# Patient Record
Sex: Female | Born: 1995 | Race: White | Hispanic: No | Marital: Single | Smoking: Never smoker
Health system: Southern US, Community
[De-identification: ages and names within clinical notes are randomized; demographics above are authoritative.]

## PROBLEM LIST (undated history)

## (undated) DIAGNOSIS — D691 Qualitative platelet defects: Secondary | ICD-10-CM

## (undated) DIAGNOSIS — D681 Hereditary factor XI deficiency: Secondary | ICD-10-CM

## (undated) DIAGNOSIS — C4491 Basal cell carcinoma of skin, unspecified: Secondary | ICD-10-CM

## (undated) DIAGNOSIS — F909 Attention-deficit hyperactivity disorder, unspecified type: Secondary | ICD-10-CM

## (undated) DIAGNOSIS — E785 Hyperlipidemia, unspecified: Secondary | ICD-10-CM

## (undated) DIAGNOSIS — D699 Hemorrhagic condition, unspecified: Secondary | ICD-10-CM

## (undated) HISTORY — DX: Attention-deficit hyperactivity disorder, unspecified type: F90.9

## (undated) HISTORY — DX: Basal cell carcinoma of skin, unspecified: C44.91

## (undated) HISTORY — PX: INGUINAL HERNIA REPAIR: SUR1180

## (undated) HISTORY — DX: Hereditary factor XI deficiency: D68.1

## (undated) HISTORY — DX: Hyperlipidemia, unspecified: E78.5

## (undated) HISTORY — PX: TYMPANOSTOMY TUBE PLACEMENT: SHX32

---
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
## 2014-01-14 ENCOUNTER — Encounter: Payer: Self-pay | Admitting: Obstetrics & Gynecology

## 2014-01-16 ENCOUNTER — Encounter: Payer: Self-pay | Admitting: Obstetrics & Gynecology

## 2014-01-16 ENCOUNTER — Ambulatory Visit (INDEPENDENT_AMBULATORY_CARE_PROVIDER_SITE_OTHER): Payer: Federal, State, Local not specified - PPO | Admitting: Obstetrics & Gynecology

## 2014-01-16 VITALS — BP 92/58 | HR 68 | Resp 12 | Ht 67.0 in | Wt 161.2 lb

## 2014-01-16 DIAGNOSIS — Z01419 Encounter for gynecological examination (general) (routine) without abnormal findings: Secondary | ICD-10-CM

## 2014-01-16 DIAGNOSIS — D682 Hereditary deficiency of other clotting factors: Secondary | ICD-10-CM

## 2014-01-16 DIAGNOSIS — Z0000 Encounter for general adult medical examination without abnormal findings: Secondary | ICD-10-CM

## 2014-01-16 LAB — POCT URINALYSIS DIPSTICK
Bilirubin, UA: NEGATIVE
Glucose, UA: NEGATIVE
KETONES UA: NEGATIVE
LEUKOCYTES UA: NEGATIVE
Nitrite, UA: NEGATIVE
PH UA: 6
PROTEIN UA: NEGATIVE
RBC UA: NEGATIVE
Urobilinogen, UA: NEGATIVE

## 2014-01-16 MED ORDER — NORGESTIM-ETH ESTRAD TRIPHASIC 0.18/0.215/0.25 MG-35 MCG PO TABS: 1.0000 | ORAL_TABLET | Freq: Every day | ORAL | Status: DC

## 2014-01-16 NOTE — Progress Notes (Signed)
18 y.o. G0P0000 SingleCaucasianF here for annual exam.  Going to [employer] for school.  Not sexually active.  OCPs have worked great for cycle control.  Had hb with Dr. Marin Olp 11.2.  Pt's mother wants to know if any treatment needed.  Vitamin discussed.  Having some issue with urgency sensation.  Does leak occasionally, especially after running.  No dysuria.  No hematuria.  No flank pain.   Patient's last menstrual period was 01/07/2014.          Sexually active: no  The current method of family planning is OCP (estrogen/progesterone).    Exercising: yes   Smoker:  no  Health Maintenance: Pap:  none History of abnormal Pap:  no MMG:  none Colonoscopy:  none BMD:   none TDaP:  2008 Screening Labs: n/a, Hb today: n/a, Urine today: PH-6.0   reports that she has never smoked. She has never used smokeless tobacco. She reports that she does not drink alcohol or use illicit drugs.  Past Medical History  Diagnosis Date  . Bleeding disorder     h/o platelet dysfunction  . Factor X deficiency     hemophilia C  . Platelet dysfunction     Past Surgical History  Procedure Laterality Date  . Tympanostomy tube placement    . Inguinal hernia repair      Current Outpatient Prescriptions  Medication Sig Dispense Refill  . Norgestimate-Ethinyl Estradiol Triphasic (TRI-SPRINTEC) 0.18/0.215/0.25 MG-35 MCG tablet Take 1 tablet by mouth daily.  3 Package  4   No current facility-administered medications for this visit.    Family History  Problem Relation Age of Onset  . Cancer Mother   . Breast cancer Mother   . Clotting disorder Brother   . Diabetes Maternal Grandmother   . Hypertension Maternal Grandmother   . Hypertension Maternal Grandfather   . Depression Maternal Grandfather   . Diabetes Paternal Grandfather     and on dialysis  . Heart Problems Paternal Grandfather     heart transplant  . Cancer Other     maternal great grandmother-Stage III, adenocarcenoma on broad  ligament of fallopian tube  . Uterine cancer Other     maternal great aunt  . Cancer Other     maternal cousin-cervical or ovarian  . Other Maternal Grandmother     stone in pancreas  . Hypertension Maternal Grandfather   . Heart disease Paternal Grandfather     heart transplant  . Other Mother     h/o adenomyosis  . Varicose Veins Mother   . Depression Maternal Grandfather   . Other Brother     Factor XI    ROS:  Pertinent items are noted in HPI.  Otherwise, a comprehensive ROS was negative.  Exam:   BP 92/58  Pulse 68  Resp 12  Ht 5\' 7"  (1.702 m)  Wt 161 lb 3.2 oz (73.12 kg)  BMI 25.24 kg/m2  LMP 01/07/2014  Weight change: +7#  Height: 5\' 7"  (170.2 cm)  Ht Readings from Last 3 Encounters:  01/16/14 5\' 7"  (1.702 m) (86%*, Z = 1.08)  02/07/13 5\' 7"  (1.702 m) (87%*, Z = 1.10)  01/15/13 5\' 7"  (1.702 m) (87%*, Z = 1.10)   * Growth percentiles are based on CDC 2-20 Years data.    General appearance: alert, cooperative and appears stated age Head: Normocephalic, without obvious abnormality, atraumatic Neck: no adenopathy, supple, symmetrical, trachea midline and thyroid normal to inspection and palpation Lungs: clear to auscultation bilaterally Breasts: normal  appearance, no masses or tenderness Heart: regular rate and rhythm Abdomen: soft, non-tender; bowel sounds normal; no masses,  no organomegaly Extremities: extremities normal, atraumatic, no cyanosis or edema Skin: Skin color, texture, turgor normal. No rashes or lesions Lymph nodes: Cervical, supraclavicular, and axillary nodes normal. No abnormal inguinal nodes palpated Neurologic: Grossly normal   Pelvic: No pelvic exam  A:  Well Woman with normal exam Possible OAB vs IC H/O Factor X deficiency  P:   Mammogram starting 10 years younger than mother's age at diagnosis pap smear not indicated Do not need any additional labs needed at this time Urologist evaluation discussed.  Declined at this time. return  annually or prn  An After Visit Summary was printed and given to the patient.

## 2014-01-16 NOTE — Patient Instructions (Signed)

## 2014-01-17 DIAGNOSIS — D682 Hereditary deficiency of other clotting factors: Secondary | ICD-10-CM | POA: Insufficient documentation

## 2014-03-11 ENCOUNTER — Telehealth: Payer: Self-pay | Admitting: *Deleted

## 2014-03-11 NOTE — Telephone Encounter (Signed)
Mom called wanting to schedule a nurse visit for hep a vaccine. i advised mom that it would be best for her to schedule an appointment since the patient has not been seen here since 2013. She as seen in sept of last year for a nurse visit only. Mom voiced understanding

## 2014-03-17 ENCOUNTER — Encounter: Payer: Self-pay | Admitting: Nurse Practitioner

## 2014-03-17 ENCOUNTER — Ambulatory Visit (INDEPENDENT_AMBULATORY_CARE_PROVIDER_SITE_OTHER): Payer: Federal, State, Local not specified - PPO | Admitting: Nurse Practitioner

## 2014-03-17 VITALS — BP 100/62 | HR 80 | Temp 98.5°F | Ht 67.0 in | Wt 157.0 lb

## 2014-03-17 DIAGNOSIS — R300 Dysuria: Secondary | ICD-10-CM

## 2014-03-17 DIAGNOSIS — B373 Candidiasis of vulva and vagina: Secondary | ICD-10-CM

## 2014-03-17 DIAGNOSIS — B3731 Acute candidiasis of vulva and vagina: Secondary | ICD-10-CM

## 2014-03-17 LAB — POCT URINALYSIS DIPSTICK
BILIRUBIN UA: NEGATIVE
Glucose, UA: NEGATIVE
Ketones, UA: NEGATIVE
Leukocytes, UA: NEGATIVE
NITRITE UA: NEGATIVE
PH UA: 6
RBC UA: NEGATIVE
Urobilinogen, UA: NEGATIVE

## 2014-03-17 MED ORDER — NYSTATIN-TRIAMCINOLONE 100000-0.1 UNIT/GM-% EX OINT: 1.0000 "application " | TOPICAL_OINTMENT | Freq: Two times a day (BID) | CUTANEOUS | Status: DC

## 2014-03-17 MED ORDER — FLUCONAZOLE 150 MG PO TABS: 150.0000 mg | ORAL_TABLET | Freq: Once | ORAL | Status: DC

## 2014-03-17 NOTE — Patient Instructions (Signed)

## 2014-03-17 NOTE — Progress Notes (Signed)
Subjective:     Patient ID: Veronica [name], female   DOB: [birth year], 18 y.o.   MRN: [id number]  HPI   This 18 yo SW Fe complains of vaginitis symptoms for about 2 weeks.  She noted these got worse while at the beach.  She has never been SA.  No urinary symptoms.  No fever or chill.  She is also working as a Surveyor, minerals and part Restaurant manager, fast food.  She also has been going to the gym and not always changing her clothes right away.      Review of Systems  Constitutional: Negative for fever, chills and fatigue.  Gastrointestinal: Negative.  Negative for nausea, vomiting, abdominal pain, diarrhea, constipation and abdominal distention.  Genitourinary: Positive for vaginal discharge. Negative for dysuria, urgency, frequency, flank pain, genital sores, vaginal pain and pelvic pain.  Musculoskeletal: Negative.   Skin: Negative.   Neurological: Negative.   Psychiatric/Behavioral: Negative.        Objective:   Physical Exam  Constitutional: She is oriented to person, place, and time. She appears well-developed and well-nourished. No distress.  Abdominal: Soft. She exhibits no distension. There is no tenderness. There is no rebound and no guarding.  No flank pain  Genitourinary:  External irritaiton and linear cuts consistent with yeast,  White thick vaginal discharge. No cervicitis.  Wet Prep: Ph: 3.5; NSS: negative; KOH: + yeast.  Neurological: She is alert and oriented to person, place, and time.  Skin: Skin is warm and dry.       Assessment:     Yeast vaginitis    Plan:     Diflucan 150 mg X 2 Local treatment with Mycolog cream 2-3 times daily.until resolved. Discussed regular vaginal health concerns and washing after exercise.

## 2014-03-18 NOTE — Progress Notes (Signed)
Encounter reviewed by Dr. Muneer Leider Silva.  

## 2014-12-28 HISTORY — PX: BASAL CELL CARCINOMA EXCISION: SHX1214

## 2015-01-23 ENCOUNTER — Ambulatory Visit (INDEPENDENT_AMBULATORY_CARE_PROVIDER_SITE_OTHER): Payer: Federal, State, Local not specified - PPO | Admitting: Obstetrics & Gynecology

## 2015-01-23 ENCOUNTER — Encounter: Payer: Self-pay | Admitting: Obstetrics & Gynecology

## 2015-01-23 DIAGNOSIS — Z0000 Encounter for general adult medical examination without abnormal findings: Secondary | ICD-10-CM

## 2015-01-23 DIAGNOSIS — Z01419 Encounter for gynecological examination (general) (routine) without abnormal findings: Secondary | ICD-10-CM

## 2015-01-23 MED ORDER — NORGESTIM-ETH ESTRAD TRIPHASIC 0.18/0.215/0.25 MG-35 MCG PO TABS: 1.0000 | ORAL_TABLET | Freq: Every day | ORAL | Status: DC

## 2015-01-23 NOTE — Addendum Note (Signed)
Addended by: Megan Salon on: 01/23/2015 02:13 PM   Modules accepted: Miquel Dunn

## 2015-01-23 NOTE — Progress Notes (Addendum)
19 y.o. G0P0 SingleCaucasianF here for annual exam.  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  Got it her senior year in high school.  Pre-nursing in school.    Cycles are regular.  On OCPs.  Pt still having some urgency symptoms but does not want to see a urologist at this time.    Patient's last menstrual period was 01/04/2015.          Sexually active: No.  The current method of family planning is OCP (estrogen/progesterone).    Exercising: Yes.    cardio and weights Smoker:  no  Health Maintenance: Pap:  none History of abnormal Pap:  no MMG:  none Colonoscopy:  none BMD:   none TDaP:  2008 Screening Labs: n/a, Hb today: n/a, Urine today: not able to get sample    reports that she has never smoked. She has never used smokeless tobacco. She reports that she does not drink alcohol or use illicit drugs.  Past Medical History  Diagnosis Date  . Bleeding disorder     h/o platelet dysfunction  . Factor X deficiency     hemophilia C  . Platelet dysfunction     Past Surgical History  Procedure Laterality Date  . Tympanostomy tube placement    . Inguinal hernia repair    . Wisdom tooth extraction  2014  . Skin biopsy  5/16    Current Outpatient Prescriptions  Medication Sig Dispense Refill  . Norgestimate-Ethinyl Estradiol Triphasic (TRI-SPRINTEC) 0.18/0.215/0.25 MG-35 MCG tablet Take 1 tablet by mouth daily. 3 Package 4   No current facility-administered medications for this visit.    Family History  Problem Relation Age of Onset  . Cancer Mother   . Breast cancer Mother   . Clotting disorder Brother   . Diabetes Maternal Grandmother   . Hypertension Maternal Grandmother   . Hypertension Maternal Grandfather   . Depression Maternal Grandfather   . Diabetes Paternal Grandfather     and on dialysis  . Heart Problems Paternal Grandfather     heart transplant  . Cancer Other     maternal great grandmother-Stage III, adenocarcenoma on broad ligament of fallopian tube  .  Uterine cancer Other     maternal great aunt  . Cancer Other     maternal cousin-cervical or ovarian  . Other Maternal Grandmother     stone in pancreas  . Hypertension Maternal Grandfather   . Heart disease Paternal Grandfather     heart transplant  . Other Mother     h/o adenomyosis  . Varicose Veins Mother   . Depression Maternal Grandfather   . Other Brother     Factor XI    ROS:  Pertinent items are noted in HPI.  Otherwise, a comprehensive ROS was negative.  Exam:   General appearance: alert, cooperative and appears stated age Head: Normocephalic, without obvious abnormality, atraumatic Neck: no adenopathy, supple, symmetrical, trachea midline and thyroid normal to inspection and palpation Lungs: clear to auscultation bilaterally Breasts: normal appearance, no masses or tenderness Heart: regular rate and rhythm Abdomen: soft, non-tender; bowel sounds normal; no masses,  no organomegaly Extremities: extremities normal, atraumatic, no cyanosis or edema Skin: Skin color, texture, turgor normal. No rashes or lesions Lymph nodes: Cervical, supraclavicular, and axillary nodes normal. No abnormal inguinal nodes palpated Neurologic: Grossly normal   Pelvic:  Deferred due to not sexually active  A:  Well Woman with normal exam Possible OAB vs IC H/O Factor X deficiency Has done [employer]  P: Mammogram starting 10 years younger than mother's age at diagnosis -- aged 34/35 pap smear not indicated  Do not need any additional labs needed at this time return annually or prn

## 2015-03-24 ENCOUNTER — Encounter: Payer: Self-pay | Admitting: Sports Medicine

## 2015-03-24 ENCOUNTER — Ambulatory Visit (INDEPENDENT_AMBULATORY_CARE_PROVIDER_SITE_OTHER): Payer: Federal, State, Local not specified - PPO | Admitting: Sports Medicine

## 2015-03-24 VITALS — BP 103/58 | HR 80 | Ht 67.0 in | Wt 161.0 lb

## 2015-03-24 DIAGNOSIS — R0989 Other specified symptoms and signs involving the circulatory and respiratory systems: Secondary | ICD-10-CM | POA: Insufficient documentation

## 2015-03-24 DIAGNOSIS — R09A2 Foreign body sensation, throat: Secondary | ICD-10-CM | POA: Insufficient documentation

## 2015-03-24 DIAGNOSIS — R198 Other specified symptoms and signs involving the digestive system and abdomen: Secondary | ICD-10-CM | POA: Insufficient documentation

## 2015-03-24 DIAGNOSIS — F458 Other somatoform disorders: Secondary | ICD-10-CM | POA: Diagnosis not present

## 2015-03-24 MED ORDER — ESOMEPRAZOLE MAGNESIUM 40 MG PO CPDR: DELAYED_RELEASE_CAPSULE | ORAL | Status: DC

## 2015-03-24 MED ORDER — MOMETASONE FUROATE 50 MCG/ACT NA SUSP: NASAL | Status: DC

## 2015-03-24 NOTE — Patient Instructions (Signed)
Globus Syndrome Globus Syndrome is a feeling of a lump or a sensation of something caught in your throat. Eating food or drinking fluids does not seem to get rid of it. Yet it is not noticeable during the actual act of swallowing food or liquids. Usually there is nothing physically wrong. It is troublesome because it is an unpleasant sensation which is sometimes difficult to ignore and at times may seem to worsen. The syndrome is quite common. It is estimated 45% of the population experiences features of the condition at some stage during their lives. The symptoms are usually temporary. The largest group of people who feel the need to seek medical treatment is females between the ages of 20 to 80.  CAUSES  Globus Syndrome appears to be triggered by or aggravated by stress, anxiety and depression.  Tension related to stress could product abnormal muscle spasms in the esophagus which would account for the sensation of a lump or ball in your throat.  Frequent swallowing or drying of the throat caused by anxiety or other strong emotions can also produce this uncomfortable sensation in your throat.  Fear and sadness can be expressed by the body in many ways. For instance, if you had a relative with throat cancer you might become overly concerned about your own health and develop uncomfortable sensations in your throat.  The reaction to a crisis or a trauma event in your life can take the form of a lump in your throat. It is as if you are indirectly saying you can not handle or "swallow" one more thing.  Acid reflux (LPRD)  Post-nasal drip  DIAGNOSIS  Usually your caregiver will know what is wrong by talking to you and examining you. If the condition persists for several days, more testing may be done to make sure there is not another problem present. This is usually not the case. TREATMENT   Reassurance is often the best treatment available. Usually the problem leaves without treatment over several  days.  Sometimes anti-anxiety medications may be prescribed.  Counseling or talk therapy can also help with strong underlying emotions.  Note that in most cases this is not something that keeps coming back and you should not be concerned or worried. Document Released: 11/05/2003 Document Revised: 11/07/2011 Document Reviewed: 04/03/2008 [facility] Patient Information 2015 [place], [place]. This information is not intended to replace advice given to you by your health care provider. Make sure you discuss any questions you have with your health care provider.

## 2015-03-24 NOTE — Progress Notes (Signed)
  Subjective:    CC: Lump in throat  HPI: Veronica [name] is a pleasant and healthy 19 year old female, she does have a factor X deficiency, for the past several months she's had increasing sensation of objects stuck in her throat, she denies actually choking on any food, she does have some nasal discharge, but denies any other type symptoms. Symptoms are not worsened by anxiety or stress. They also occur with heavy breathing. Moderate, persistent. No shortness of breath.  Past medical history, Surgical history, Family history not pertinant except as noted below, Social history, Allergies, and medications have been entered into the medical record, reviewed, and no changes needed.   Review of Systems: No fevers, chills, night sweats, weight loss, chest pain, or shortness of breath.   Objective:    General: Well Developed, well nourished, and in no acute distress.  Neuro: Alert and oriented x3, extra-ocular muscles intact, sensation grossly intact.  HEENT: Normocephalic, atraumatic, pupils equal round reactive to light, neck supple, no masses, no lymphadenopathy, thyroid nonpalpable. Oropharynx, nasopharynx, ear canals unremarkable. Skin: Warm and dry, no rashes. Cardiac: Regular rate and rhythm, no murmurs rubs or gallops, no lower extremity edema.  Respiratory: Clear to auscultation bilaterally. Not using accessory muscles, speaking in full sentences.  Impression and Recommendations:    I spent 25 minutes with this patient, greater than 50% was face-to-face time counseling regarding the above diagnoses

## 2015-03-24 NOTE — Assessment & Plan Note (Signed)
Fairly vague, this does likely represent laryngopharyngeal reflux versus postnasal drip. Adding nasal, Nexium.  Neck and soft tissue x-ray declined. Return in one month.

## 2015-12-21 ENCOUNTER — Ambulatory Visit: Payer: Federal, State, Local not specified - PPO | Admitting: Nurse Practitioner

## 2015-12-21 ENCOUNTER — Telehealth: Payer: Self-pay | Admitting: Nurse Practitioner

## 2015-12-21 ENCOUNTER — Ambulatory Visit (INDEPENDENT_AMBULATORY_CARE_PROVIDER_SITE_OTHER): Payer: Federal, State, Local not specified - PPO | Admitting: Nurse Practitioner

## 2015-12-21 ENCOUNTER — Encounter: Payer: Self-pay | Admitting: Nurse Practitioner

## 2015-12-21 VITALS — BP 108/60 | HR 82 | Temp 97.7°F | Resp 16 | Ht 67.0 in | Wt 168.0 lb

## 2015-12-21 DIAGNOSIS — R309 Painful micturition, unspecified: Secondary | ICD-10-CM

## 2015-12-21 DIAGNOSIS — N760 Acute vaginitis: Secondary | ICD-10-CM | POA: Diagnosis not present

## 2015-12-21 LAB — POCT URINALYSIS DIPSTICK
Bilirubin, UA: NEGATIVE
Blood, UA: NEGATIVE
Glucose, UA: NEGATIVE
Ketones, UA: NEGATIVE
Nitrite, UA: NEGATIVE
Protein, UA: NEGATIVE
Urobilinogen, UA: NEGATIVE
pH, UA: 7

## 2015-12-21 NOTE — Progress Notes (Signed)
20 y.o. Single Caucasian female G0P0 here with complaint of vaginal symptoms of itching, burning, and increase discharge. Describes discharge as white to yellow in color.  She has a history of yeast infection after work out and sometimes after menses.  Onset of symptoms 7-10 days ago. Denies new personal products. She first became SA in November and a recent change in partner about a week before her last menses.  Some STD concerns. Urinary symptoms with urinary urgency & frequency . Contraception is OCP. Denies fever chills, back pain, or abdominal pain.   O:  Healthy female WDWN Affect: normal, orientation x 3  Exam: no distress Abdomen:  Soft and non tender Lymph node: no enlargement or tenderness Pelvic exam: External genital: normal female BUS: negative Vagina: white to yellow and thick discharge noted.  Affirm and GC/CHL taken. Cervix: normal, non tender, no CMT Adnexa:normal, non tender, no masses or fullness noted    A: Vaginitis  R/O STD's  R/O UTI - doubt as this is most likely vaginal contamination  OCP for contraception   P: Discussed findings of vaginitis and etiology. Discussed Aveeno or baking soda sitz bath for comfort. Avoid moist clothes or pads for extended period of time. If working out in gym clothes or swim suits for long periods of time change underwear or bottoms of swimsuit if possible. Olive Oil/Coconut Oil use for skin protection prior to activity can be used to external skin.  Rx: hold await test results  Follow with Affirm  Call cell phone with results - [facility] [place]  RV prn

## 2015-12-21 NOTE — Telephone Encounter (Signed)
Pt did come in for apt.

## 2015-12-21 NOTE — Patient Instructions (Signed)

## 2015-12-21 NOTE — Telephone Encounter (Signed)
Patient called and cancelled her appointment for "yeast infection" today due to a schedule conflict. She scheduled the appointment then called back and cancelled not even 10 minutes later. She said she will go to the student health center at school tomorrow. FYI only.

## 2015-12-23 ENCOUNTER — Other Ambulatory Visit: Payer: Self-pay | Admitting: Nurse Practitioner

## 2015-12-23 LAB — WET PREP BY MOLECULAR PROBE
CANDIDA SPECIES: POSITIVE — AB
Gardnerella vaginalis: POSITIVE — AB
Trichomonas vaginosis: NEGATIVE

## 2015-12-23 MED ORDER — METRONIDAZOLE 0.75 % VA GEL: 1.0000 | Freq: Every day | VAGINAL | Status: DC

## 2015-12-23 MED ORDER — FLUCONAZOLE 150 MG PO TABS: 150.0000 mg | ORAL_TABLET | Freq: Once | ORAL | Status: DC

## 2015-12-24 LAB — URINE CULTURE: Colony Count: 4000

## 2015-12-24 LAB — IPS N GONORRHOEA AND CHLAMYDIA BY PCR

## 2015-12-25 ENCOUNTER — Telehealth: Payer: Self-pay | Admitting: Nurse Practitioner

## 2015-12-25 NOTE — Telephone Encounter (Signed)
Per DPR pt wanted results left on voice mail for cell #.  She is given Wet Prep results and that med's have already been sent to her pharmacy.  Also the GC and Chl was negative.  If any questions to call back.

## 2015-12-25 NOTE — Progress Notes (Signed)
Encounter reviewed by Dr. Margot Oriordan Amundson C. Silva.  

## 2016-02-25 ENCOUNTER — Other Ambulatory Visit: Payer: Self-pay | Admitting: Obstetrics & Gynecology

## 2016-02-26 NOTE — Telephone Encounter (Signed)
Medication refill request: TRI-SPRINTEC  Last AEX:  01/23/15 SM Next AEX: 03/24/16  Last MMG (if hormonal medication request): n/a Refill authorized: 01/23/15 #3packs w/4 refills; today #1pack w/1 refill? Please advise

## 2016-02-28 ENCOUNTER — Other Ambulatory Visit: Payer: Self-pay | Admitting: Obstetrics & Gynecology

## 2016-02-29 NOTE — Telephone Encounter (Signed)
Left msg to call back. Need to verify pharmacy.

## 2016-03-24 ENCOUNTER — Encounter: Payer: Self-pay | Admitting: Obstetrics & Gynecology

## 2016-03-24 ENCOUNTER — Ambulatory Visit: Payer: Federal, State, Local not specified - PPO | Admitting: Obstetrics & Gynecology

## 2016-03-24 VITALS — BP 90/60 | HR 74 | Resp 16 | Ht 67.5 in | Wt 162.0 lb

## 2016-03-24 DIAGNOSIS — Z01419 Encounter for gynecological examination (general) (routine) without abnormal findings: Secondary | ICD-10-CM

## 2016-03-24 DIAGNOSIS — C4491 Basal cell carcinoma of skin, unspecified: Secondary | ICD-10-CM

## 2016-03-24 DIAGNOSIS — Z0000 Encounter for general adult medical examination without abnormal findings: Secondary | ICD-10-CM | POA: Diagnosis not present

## 2016-03-24 MED ORDER — NORGESTIM-ETH ESTRAD TRIPHASIC 0.18/0.215/0.25 MG-35 MCG PO TABS: 1.0000 | ORAL_TABLET | Freq: Every day | ORAL | 4 refills | Status: DC

## 2016-03-24 NOTE — Progress Notes (Signed)
20 y.o. G0P0 SingleCaucasianF here for annual exam.  Doing well.  Living at home.  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    Cycles are regular.   Cycles are about 3-4 days.  Mostly bleeding is just light.  STD testing was negative in April.    Patient's last menstrual period was 03/02/2016 (exact date).          Sexually active: Yes.    The current method of family planning is OCP (estrogen/progesterone).    Exercising: Yes.    Cardio, weights Smoker:  no  Health Maintenance: Pap:  Never Gardasil: Completed 2008  TDaP:  01/2007  Hep C testing: No Screening Labs: Unsure   reports that she has never smoked. She has never used smokeless tobacco. She reports that she drinks alcohol. She reports that she does not use drugs.  Past Medical History:  Diagnosis Date  . Basal cell carcinoma ~ 02/2015   Scalp / removed  . Bleeding disorder ([employer])    h/o platelet dysfunction  . Factor X deficiency ([place])    hemophilia C  . Platelet dysfunction [facility])     Past Surgical History:  Procedure Laterality Date  . INGUINAL HERNIA REPAIR    . SKIN BIOPSY  5/16  . TYMPANOSTOMY TUBE PLACEMENT    . WISDOM TOOTH EXTRACTION  2014    Current Outpatient Prescriptions  Medication Sig Dispense Refill  . Multiple Vitamin (MULTIVITAMIN) tablet Take 1 tablet by mouth daily.    . TRI-SPRINTEC 0.18/0.215/0.25 MG-35 MCG tablet TAKE 1 TABLET BY MOUTH DAILY. 84 tablet 0   No current facility-administered medications for this visit.     Family History  Problem Relation Age of Onset  . Cancer Mother   . Breast cancer Mother   . Other Mother     h/o adenomyosis  . Varicose Veins Mother   . Clotting disorder Brother   . Diabetes Maternal Grandmother   . Hypertension Maternal Grandmother   . Other Maternal Grandmother     stone in pancreas  . Hypertension Maternal Grandfather   . Depression Maternal Grandfather   . Diabetes Paternal Grandfather     and  on dialysis  . Heart Problems Paternal Grandfather     heart transplant  . Heart disease Paternal Grandfather     heart transplant  . Cancer Other     maternal great grandmother-Stage III, adenocarcenoma on broad ligament of fallopian tube  . Uterine cancer Other     maternal great aunt  . Cancer Other     maternal cousin-cervical or ovarian  . Other Brother     Factor XI    ROS:  Pertinent items are noted in HPI.  Otherwise, a comprehensive ROS was negative.  Exam:   BP 90/60 (BP Location: Right Arm, Patient Position: Sitting, Cuff Size: Normal)   Pulse 74   Resp 16   Ht 5' 7.5" (1.715 m)   Wt 162 lb (73.5 kg)   LMP 03/02/2016 (Exact Date)   BMI 25.00 kg/m      Height: 5' 7.5" (171.5 cm)  Ht Readings from Last 3 Encounters:  03/24/16 5' 7.5" (1.715 m)  12/21/15 5\' 7"  (1.702 m)  03/24/15 5\' 7"  (1.702 m) (86 %, Z= 1.06)*   * Growth percentiles are based on CDC 2-20 Years data.   General appearance: alert, cooperative and appears stated age Head: Normocephalic, without obvious abnormality, atraumatic Neck: no adenopathy, supple, symmetrical,  trachea midline and thyroid normal to inspection and palpation Lungs: clear to auscultation bilaterally Breasts: normal appearance, no masses or tenderness Heart: regular rate and rhythm Abdomen: soft, non-tender; bowel sounds normal; no masses,  no organomegaly Extremities: extremities normal, atraumatic, no cyanosis or edema Skin: Skin color, texture, turgor normal. No rashes or lesions Lymph nodes: Cervical, supraclavicular, and axillary nodes normal. No abnormal inguinal nodes palpated Neurologic: Grossly normal   Pelvic: External genitalia:  no lesions No pelvic exam performed.  Chaperone was present for exam.  A:  Well Woman with normal exam H/O urinary frequency.  Has never done evaluation for IC although we have discussed this in the past H/O Factor X deficiency BCC removed from scalp 7/16.  Seeing derm yearly. Had  STD testing 4/17  P:                   Mammogram starting 10 years younger than mother's age at diagnosis pap smear not indicated.  Will start next year. Yearly dermatology visits recommended. RF for tri-sprintec generic #3 months supply/4RF return annually or prn

## 2016-04-19 ENCOUNTER — Ambulatory Visit: Payer: Federal, State, Local not specified - PPO | Admitting: Obstetrics & Gynecology

## 2016-05-27 ENCOUNTER — Other Ambulatory Visit: Payer: Self-pay | Admitting: Obstetrics & Gynecology

## 2016-07-19 ENCOUNTER — Encounter: Payer: Federal, State, Local not specified - PPO | Admitting: Sports Medicine

## 2016-07-25 ENCOUNTER — Encounter: Payer: Self-pay | Admitting: Sports Medicine

## 2016-07-25 ENCOUNTER — Ambulatory Visit (INDEPENDENT_AMBULATORY_CARE_PROVIDER_SITE_OTHER): Payer: Federal, State, Local not specified - PPO | Admitting: Sports Medicine

## 2016-07-25 DIAGNOSIS — Z0000 Encounter for general adult medical examination without abnormal findings: Secondary | ICD-10-CM | POA: Diagnosis not present

## 2016-07-25 DIAGNOSIS — E781 Pure hyperglyceridemia: Secondary | ICD-10-CM | POA: Diagnosis not present

## 2016-07-25 LAB — COMPREHENSIVE METABOLIC PANEL WITH GFR
Albumin: 4 g/dL (ref 3.6–5.1)
BUN: 9 mg/dL (ref 7–25)
CO2: 27 mmol/L (ref 20–31)
Chloride: 102 mmol/L (ref 98–110)
Creat: 0.88 mg/dL (ref 0.50–1.10)
Sodium: 138 mmol/L (ref 135–146)

## 2016-07-25 LAB — COMPREHENSIVE METABOLIC PANEL
ALT: 9 U/L (ref 6–29)
AST: 18 U/L (ref 10–30)
Alkaline Phosphatase: 55 U/L (ref 33–115)
Calcium: 9.3 mg/dL (ref 8.6–10.2)
Glucose, Bld: 85 mg/dL (ref 65–99)
Potassium: 4.6 mmol/L (ref 3.5–5.3)
Total Bilirubin: 0.3 mg/dL (ref 0.2–1.2)
Total Protein: 7.3 g/dL (ref 6.1–8.1)

## 2016-07-25 LAB — LIPID PANEL
Cholesterol: 199 mg/dL (ref <200)
HDL: 41 mg/dL — ABNORMAL LOW (ref >50)
Total CHOL/HDL Ratio: 4.9 Ratio (ref <5.0)
Triglycerides: 467 mg/dL — ABNORMAL HIGH (ref <150)

## 2016-07-25 LAB — CBC
HCT: 38.6 % (ref 35.0–45.0)
Hemoglobin: 12.6 g/dL (ref 11.7–15.5)
MCH: 28.2 pg (ref 27.0–33.0)
MCHC: 32.6 g/dL (ref 32.0–36.0)
MCV: 86.4 fL (ref 80.0–100.0)
MPV: 8.6 fL (ref 7.5–12.5)
Platelets: 310 K/uL (ref 140–400)
RBC: 4.47 MIL/uL (ref 3.80–5.10)
RDW: 13.2 % (ref 11.0–15.0)
WBC: 7.1 10*3/uL (ref 3.8–10.8)

## 2016-07-25 LAB — TSH: TSH: 0.85 mIU/L

## 2016-07-25 LAB — HEMOGLOBIN A1C
Hgb A1c MFr Bld: 5 % (ref <5.7)
Mean Plasma Glucose: 97 mg/dL

## 2016-07-25 NOTE — Progress Notes (Signed)
  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  Past medical history:  Negative.  See flowsheet/record as well for more information.  Surgical history: Negative.  See flowsheet/record as well for more information.  Family history: Negative.  See flowsheet/record as well for more information.  Social history: Negative.  See flowsheet/record as well for more information.  Allergies, and medications have been entered into the medical record, reviewed, and no changes needed.    Review of Systems: No headache, visual changes, nausea, vomiting, diarrhea, constipation, dizziness, abdominal pain, skin rash, fevers, chills, night sweats, swollen lymph nodes, weight loss, chest pain, body aches, joint swelling, muscle aches, shortness of breath, mood changes, visual or auditory hallucinations.  Objective:    General: Well Developed, well nourished, and in no acute distress.  Neuro: Alert and oriented x3, extra-ocular muscles intact, sensation grossly intact. Cranial nerves II through XII are intact, motor, sensory, and coordinative functions are all intact. HEENT: Normocephalic, atraumatic, pupils equal round reactive to light, neck supple, no masses, no lymphadenopathy, thyroid nonpalpable. Oropharynx, nasopharynx, external ear canals are unremarkable. Skin: Warm and dry, no rashes noted.  Cardiac: Regular rate and rhythm, no murmurs rubs or gallops.  Respiratory: Clear to auscultation bilaterally. Not using accessory muscles, speaking in full sentences.  Abdominal: Soft, nontender, nondistended, positive bowel sounds, no masses, no organomegaly.  Musculoskeletal: Shoulder, elbow, wrist, hip, knee, ankle stable, and with full range of motion.  Impression and Recommendations:    The patient was counselled, risk factors were  discussed, anticipatory guidance given.  Annual physical exam Annual physical as above. Routine blood work. She will touch base with her OB/GYN for cervical cancer screening. 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

## 2016-07-25 NOTE — Assessment & Plan Note (Signed)
Annual physical as above. Routine blood work. She will touch base with her OB/GYN for cervical cancer screening. She is starting nursing school at [employer]

## 2016-07-26 DIAGNOSIS — E781 Pure hyperglyceridemia: Secondary | ICD-10-CM | POA: Insufficient documentation

## 2016-07-26 LAB — VITAMIN D 25 HYDROXY (VIT D DEFICIENCY, FRACTURES): Vit D, 25-Hydroxy: 48 ng/mL (ref 30–100)

## 2016-07-26 LAB — HIV ANTIBODY (ROUTINE TESTING W REFLEX): HIV 1&2 Ab, 4th Generation: NONREACTIVE

## 2016-07-26 MED ORDER — FENOFIBRATE 160 MG PO TABS: 160.0000 mg | ORAL_TABLET | Freq: Every day | ORAL | 3 refills | Status: DC

## 2016-07-26 NOTE — Addendum Note (Signed)
Addended by: Silverio Decamp on: 07/26/2016 08:56 AM   Modules accepted: Orders

## 2016-07-26 NOTE — Assessment & Plan Note (Signed)
Elevated triglycerides at 467, adding fenofibrate. Patient will also do a low cholesterol and fat diet however unlikely to get this in range with lifestyle modification alone. Recheck in 3 months.

## 2016-07-27 ENCOUNTER — Ambulatory Visit (INDEPENDENT_AMBULATORY_CARE_PROVIDER_SITE_OTHER): Payer: Federal, State, Local not specified - PPO | Admitting: Sports Medicine

## 2016-07-27 VITALS — BP 104/58 | HR 70

## 2016-07-27 DIAGNOSIS — Z111 Encounter for screening for respiratory tuberculosis: Secondary | ICD-10-CM

## 2016-07-27 LAB — TB SKIN TEST
Induration: 0 mm
TB Skin Test: NEGATIVE

## 2016-07-27 NOTE — Progress Notes (Signed)
Pt is here for a TB read. Denies fever, itching and dizziness.  Minimal redness noted.

## 2016-08-10 ENCOUNTER — Ambulatory Visit (INDEPENDENT_AMBULATORY_CARE_PROVIDER_SITE_OTHER): Payer: Federal, State, Local not specified - PPO | Admitting: Sports Medicine

## 2016-08-10 VITALS — BP 111/62 | HR 84 | Wt 159.0 lb

## 2016-08-10 DIAGNOSIS — Z23 Encounter for immunization: Secondary | ICD-10-CM

## 2016-08-10 DIAGNOSIS — Z111 Encounter for screening for respiratory tuberculosis: Secondary | ICD-10-CM

## 2016-08-10 NOTE — Progress Notes (Signed)
Patient came into clinic today for second PPD placement. 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 Pt does have a form to be completed by PCP, will bring that when she comes in for this PPD read in 2 days. Pt reports she has never tested positive for TB and had no negative reaction to last test. Pt tolerated PPD placement in right forearm well, no immediate complications. Appt scheduled for read in 2 days. No further questions.

## 2016-08-12 ENCOUNTER — Ambulatory Visit (INDEPENDENT_AMBULATORY_CARE_PROVIDER_SITE_OTHER): Payer: Federal, State, Local not specified - PPO | Admitting: Sports Medicine

## 2016-08-12 VITALS — BP 96/64 | HR 88 | Resp 16 | Wt 157.0 lb

## 2016-08-12 DIAGNOSIS — Z111 Encounter for screening for respiratory tuberculosis: Secondary | ICD-10-CM

## 2016-08-12 LAB — TB SKIN TEST
Induration: 0 mm
TB Skin Test: NEGATIVE

## 2016-08-12 NOTE — Patient Instructions (Signed)
Follow up as needed

## 2016-08-12 NOTE — Progress Notes (Signed)
PPD neg

## 2016-10-07 ENCOUNTER — Encounter: Payer: Self-pay | Admitting: Sports Medicine

## 2016-10-07 ENCOUNTER — Ambulatory Visit (INDEPENDENT_AMBULATORY_CARE_PROVIDER_SITE_OTHER): Payer: Federal, State, Local not specified - PPO | Admitting: Sports Medicine

## 2016-10-07 DIAGNOSIS — R69 Illness, unspecified: Secondary | ICD-10-CM | POA: Diagnosis not present

## 2016-10-07 DIAGNOSIS — J111 Influenza due to unidentified influenza virus with other respiratory manifestations: Secondary | ICD-10-CM | POA: Insufficient documentation

## 2016-10-07 MED ORDER — OSELTAMIVIR PHOSPHATE 75 MG PO CAPS: 75.0000 mg | ORAL_CAPSULE | Freq: Two times a day (BID) | ORAL | 0 refills | Status: DC

## 2016-10-07 MED ORDER — THERAFLU SEVERE COLD/CGH DAY 20-10-650 MG PO PACK: PACK | ORAL | 0 refills | Status: DC

## 2016-10-07 NOTE — Progress Notes (Signed)
  Subjective:    CC:  Fever  HPI: This is a pleasant 21 year old female, for the past day and a half she's had fevers up to 102, muscle aches, body aches, cough, sore throat, fatigue. Moderate, persistent, not coughing up any blood, no nausea, vomiting, no skin rash.  Past medical history:  Negative.  See flowsheet/record as well for more information.  Surgical history: Negative.  See flowsheet/record as well for more information.  Family history: Negative.  See flowsheet/record as well for more information.  Social history: Negative.  See flowsheet/record as well for more information.  Allergies, and medications have been entered into the medical record, reviewed, and no changes needed.   Review of Systems: No fevers, chills, night sweats, weight loss, chest pain, or shortness of breath.   Objective:    General: Well Developed, well nourished, and in no acute distress.  Neuro: Alert and oriented x3, extra-ocular muscles intact, sensation grossly intact.  HEENT: Normocephalic, atraumatic, pupils equal round reactive to light, neck supple, no masses, no lymphadenopathy, thyroid nonpalpable. Oropharynx, nasopharynx, ear canals unremarkable. Skin: Warm and dry, no rashes. Cardiac: Regular rate and rhythm, no murmurs rubs or gallops, no lower extremity edema.  Respiratory: Clear to auscultation bilaterally. Not using accessory muscles, speaking in full sentences.  Impression and Recommendations:    Influenza-like illness TheraFlu, Tamiflu, out of school  I spent 25 minutes with this patient, greater than 50% was face-to-face time counseling regarding the above diagnoses

## 2016-10-07 NOTE — Patient Instructions (Signed)

## 2016-10-07 NOTE — Assessment & Plan Note (Signed)
TheraFlu, Tamiflu, out of school

## 2016-10-19 ENCOUNTER — Ambulatory Visit (INDEPENDENT_AMBULATORY_CARE_PROVIDER_SITE_OTHER): Payer: Federal, State, Local not specified - PPO | Admitting: Physician Assistant

## 2016-10-19 ENCOUNTER — Telehealth: Payer: Self-pay

## 2016-10-19 VITALS — BP 105/73 | HR 101 | Temp 97.8°F | Wt 159.0 lb

## 2016-10-19 DIAGNOSIS — J039 Acute tonsillitis, unspecified: Secondary | ICD-10-CM | POA: Diagnosis not present

## 2016-10-19 DIAGNOSIS — B2790 Infectious mononucleosis, unspecified without complication: Secondary | ICD-10-CM

## 2016-10-19 MED ORDER — DICLOFENAC SODIUM 75 MG PO TBEC: 75.0000 mg | DELAYED_RELEASE_TABLET | Freq: Two times a day (BID) | ORAL | 3 refills | Status: DC

## 2016-10-19 MED ORDER — TRAMADOL 5 MG/ML ORAL SUSPENSION: 25.0000 mg | Freq: Four times a day (QID) | ORAL | 0 refills | Status: DC | PRN

## 2016-10-19 MED ORDER — TRAMADOL HCL 50 MG PO TABS: 50.0000 mg | ORAL_TABLET | Freq: Four times a day (QID) | ORAL | 0 refills | Status: DC | PRN

## 2016-10-19 MED ORDER — CEFDINIR 250 MG/5ML PO SUSR: 300.0000 mg | Freq: Two times a day (BID) | ORAL | 0 refills | Status: AC

## 2016-10-19 NOTE — Telephone Encounter (Signed)
Pt informed. Pt expressed understanding and is agreeable. Amanda Grissom CMA, RT 

## 2016-10-19 NOTE — Addendum Note (Signed)
Addended by: Nelson Chimes E on: 10/19/2016 03:14 PM   Modules accepted: Orders

## 2016-10-19 NOTE — Telephone Encounter (Signed)
Pt's mother called stating that her daughter was seen in office on 10/07/16 for influenza like illness. Pt's mother stated that she felt a little better on Saturday and then on Sunday she was feeling even worse. Pt's mother stated that she [facility] her [facility] and had some tests done. 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 Pt mother wanted to see if the doctor could call her in a steroid. She was advised to schedule an apt to be seen. Pt's mother was upset that she had to be seen even after she was just here for an influenza visit. Pt's mother then hung up on me.

## 2016-10-19 NOTE — Progress Notes (Signed)
HPI:                                                                Veronica [name] is a 21 y.o. female who presents to [facility] [place] today for sore throat  [facility] and diagnosed with Mono over the weekend. She presents today with severe sore throat that is keeping her awake at night and making it difficult to swallow. She also endorses a voice change that began 2 days ago. Denies fever, chills, n/v/d, abdominal pain.   Past Medical History:  Diagnosis Date  . Basal cell carcinoma ~ 02/2015   Scalp / removed  . 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 INGUINAL HERNIA REPAIR    . SKIN BIOPSY  5/16  . TYMPANOSTOMY TUBE PLACEMENT    . [place] EXTRACTION  2014   Social History  Substance Use Topics  . Smoking status: Never Smoker  . Smokeless tobacco: Never Used  . Alcohol use 0.0 oz/week     Comment: not regularly   family history includes Breast cancer in her mother; Cancer in her mother, other, and other; Clotting disorder in her brother; Depression in her maternal grandfather; Diabetes in her maternal grandmother and paternal grandfather; Heart Problems in her paternal grandfather; Heart disease in her paternal grandfather; Hypertension in her maternal grandfather and maternal grandmother; Other in her brother, maternal grandmother, and mother; Uterine cancer in her other; Varicose Veins in her mother.  ROS: negative except as noted in the HPI  Medications: Current Outpatient Prescriptions  Medication Sig Dispense Refill  . fenofibrate 160 MG tablet Take 1 tablet (160 mg total) by mouth daily. 90 tablet 3  . Multiple Vitamin (MULTIVITAMIN) tablet Take 1 tablet by mouth daily.    . Norgestimate-Ethinyl Estradiol Triphasic (TRI-SPRINTEC) 0.18/0.215/0.25  MG-35 MCG tablet Take 1 tablet by mouth daily. 84 tablet 4  . diclofenac (VOLTAREN) 75 MG EC tablet Take 1 tablet (75 mg total) by mouth 2 (two) times daily. (Patient not taking: Reported on 10/19/2016) 60 tablet 3   No current facility-administered medications for this visit.    No Known Allergies     Objective:  BP 105/73   Pulse (!) 101   Temp 97.8 F (36.6 C) (Oral)   Wt 159 lb (72.1 kg)   BMI 24.54 kg/m  Gen: well-groomed, cooperative, ill-appearing, not toxic-appearing, no distress HEENT: normal conjunctiva, TM's clear, nasal mucosa normal, oropharynx patent, tonsils edematous with exudates, halitosis present, uvula midline, moist mucus membranes, no frontal or maxillary sinus tenderness Pulm: Normal work of breathing, "hot potato" voice, clear to auscultation bilaterally, no wheezes, rales or rhonchi CV: Tachycardic, regular rhythm, s1 and s2 distinct, no murmurs, clicks or rubs  Neuro: alert and oriented x 3, EOM's intact Lymph: there is posterior cervical and tonsillar adenopathy Skin: warm and dry, no rashes or lesions on exposed skin, no cyanosis  [facility]  Assessment and Plan: 21 y.o. female with   1. Acute tonsillitis, unspecified etiology - looks like there  may be a secondary infection superimposed on the underlying infectious mononucleosis. Treating with Cefdinir x 7 days - Diclofenac bid, tramadol and tylenol prn for pain relief - cefdinir (OMNICEF) 250 MG/5ML suspension; Take 6 mLs (300 mg total) by mouth 2 (two) times daily.  Dispense: 60 mL; Refill: 0 - traMADol (ULTRAM) 5 mg/mL SUSP; Take 5 mLs (25 mg total) by mouth every 6 (six) hours as needed for moderate pain.  Dispense: 250 mL; Refill: 0  2. Mononucleosis - positive Monospot test at [facility] - symptomatic management  - No rigorous activity or contact sports for 4 weeks  Patient education and anticipatory guidance given Patient agrees with treatment  plan Follow-up as needed if symptoms worsen or fail to improve  Darlyne Russian PA-C

## 2016-10-19 NOTE — Patient Instructions (Signed)
Diclofenac twice a day  Tylenol 500mg  every 6 hours (take with Tramadol) Take antibiotic as prescribed for 7 days with food No rigorous activity or contact sports for 4 weeks   Infectious Mononucleosis Infectious mononucleosis is a viral infection. It is often referred to as "mono." It causes symptoms that affect various areas of the body, including the throat, upper air passages, and lymph glands. The liver or spleen may also be affected. The virus spreads from person to person (is contagious) through close contact. The illness is usually not serious, and it typically goes away in 2-4 weeks without treatment. In rare cases, symptoms can be more severe and last longer, sometimes up to several months. What are the causes? This condition is commonly caused by the Epstein-Barr virus. This virus spreads through:  Contact with an infected person's saliva or other bodily fluids, often through:  Kissing.  Sexual contact.  Coughing.  Sneezing.  Sharing utensils or drinking glasses that were recently used by an infected person.  Blood transfusions.  Organ transplantation. What increases the risk? You are more likely to develop this condition if:  You are 25-81 years old. What are the signs or symptoms? Symptoms of this condition usually appear 4-6 weeks after infection. Symptoms may develop slowly and occur at different times. Common symptoms include:  Sore throat.  Headache.  Extreme fatigue.  Muscle aches.  Swollen glands.  Fever.  Poor appetite.  Rash. Other symptoms include:  Enlarged liver or spleen.  Nausea.  Abdominal pain. How is this diagnosed? This condition may be diagnosed based on:  Your medical history.  Your symptoms.  A physical exam.  Blood tests to confirm the diagnosis. How is this treated? There is no cure for this condition. Infectious mononucleosis usually goes away on its own with time. Treatment can help relieve symptoms and may  include:  Taking medicines to relieve pain and fever.  Drinking plenty of fluids.  Getting a lot of rest.  Medicine (corticosteroids)to reduce swelling. This may be used if swelling in the throat causes breathing or swallowing problems. In some severe cases, treatment has to be given in a hospital. Follow these instructions at home: Medicines  Take over-the-counter and prescription medicines only as told by your health care provider.  Do not take ampicillin or amoxicillin. This may cause a rash.  If you are under 18, do not take aspirin because of the association with Reye syndrome. Activity  Rest as needed.  Do not participate in any of the following activities until your health care provider approves:  Contact sports. You may need to wait at least a month before participating in sports.  Exercise that requires a lot of energy.  Heavy lifting.  Gradually resume your normal activities after your fever is gone, or when your health care provider tells you that you can. Be sure to rest when you get tired. General instructions  Avoid kissing or sharing utensils or drinking glasses until your health care provider tells you that you are no longer contagious.  Drink enough fluid to keep your urine clear or pale yellow.  Do not drink alcohol.  If you have a sore throat:  Gargle with a salt-water mixture 3-4 times a day or as needed. To make a salt-water mixture, completely dissolve -1 tsp of salt in 1 cup of warm water.  Eat soft foods. Cold foods such as ice cream or frozen ice pops can soothe a sore throat.  Try sucking on hard candy.  Wash  your hands often with soap and water to avoid spreading the infection. If soap and water are not available, use hand sanitizer. How is this prevented?  Avoid contact with people who are infected with mononucleosis. An infected person may not always appear ill, but he or she can still spread the virus.  Avoid sharing utensils,  drinking glasses, or toothbrushes.  Wash your hands frequently with soap and water. If soap and water are not available, use hand sanitizer.  Use the inside of your elbow to cover your mouth when coughing or sneezing. Contact a health care provider if:  Your fever is not gone after 10 days.  You have swollen lymph nodes that are not back to normal after 4 weeks.  Your activity level is not back to normal after 2 months.  Your skin or the white parts of your eyes turn yellow (jaundice).  You have constipation. This may mean that you have:  Fewer bowel movements in a week than normal.  Difficulty having a bowel movement.  Stools that are dry, hard, or larger than normal. Get help right away if:  You have severe pain in your abdomen or shoulder.  You are drooling.  You have trouble swallowing.  You have trouble breathing.  You develop a stiff neck.  You develop a severe headache.  You cannot stop vomiting.  You have jerky movements that you cannot control (seizures).  You are confused.  You have trouble with balance.  Your nose or gums begin to bleed.  You have signs of dehydration. These may include:  Weakness.  Sunken eyes.  Pale skin.  Dry mouth.  Rapid breathing or pulse. Summary  Infectious mononucleosis, or "mono," is an infection caused by the Epstein-Barr virus.  The virus that causes this condition is spread through bodily fluids. The virus is most commonly spread by kissing or sharing drinks or utensils with an infected person.  You are more likely to develop this infection if you are 32-30 years old.  Symptoms of this condition can include sore throat, headache, fever, swollen glands, muscle aches, extreme fatigue, and swollen liver or spleen.  There is no cure for this condition. The goal of treatment is to help relieve symptoms. Treatment may include drinking plenty of water, getting a lot of rest, and taking pain relievers. This  information is not intended to replace advice given to you by your health care provider. Make sure you discuss any questions you have with your health care provider. Document Released: 08/12/2000 Document Revised: 05/03/2016 Document Reviewed: 05/03/2016 Elsevier Interactive Patient Education  2017 [employer].

## 2016-10-19 NOTE — Telephone Encounter (Signed)
Adding high-dose diclofenac which will have many of the same effects as a steroid without the immunosuppression that comes with a steroid. She should take this medication with food.

## 2016-11-01 ENCOUNTER — Telehealth: Payer: Self-pay | Admitting: Obstetrics & Gynecology

## 2016-11-01 NOTE — Telephone Encounter (Signed)
Patient has been on an antibiotic and now has a yeast infection. Offered a appointment but patient would like to speak with the nurse first.

## 2016-11-01 NOTE — Telephone Encounter (Signed)
Spoke with patient. Patient states she was recently sick with flu 2/9 followed by mono. Patient reports she was treated with tamiflu and 10 days of antibiotics. Patient states she is now experiencing white/yellow thick discharge with odor, vaginal discomfort and pain, redness, itching. Patient denies urinary complaints. Recommended OV for further evaluation. Patient states she has another appt at 10:30, will need later. Advised patient Dr. Sabra Heck and Kem Boroughs, NP out of the office tomorrow afternoon, can schedule with another provider. Patient states she prefers physician. Patient scheduled with Dr. Talbert Nan 11/02/16 at 1:15pm. Patient is agreeable to date and time.  Routing to provider for final review. Patient is agreeable to disposition. Will close encounter.  Cc: Dr. Sabra Heck, Kem Boroughs, NP

## 2016-11-02 ENCOUNTER — Encounter: Payer: Self-pay | Admitting: Obstetrics and Gynecology

## 2016-11-02 ENCOUNTER — Ambulatory Visit (INDEPENDENT_AMBULATORY_CARE_PROVIDER_SITE_OTHER): Payer: Federal, State, Local not specified - PPO | Admitting: Obstetrics and Gynecology

## 2016-11-02 VITALS — BP 90/60 | HR 88 | Temp 98.6°F | Resp 14 | Ht 67.0 in | Wt 159.0 lb

## 2016-11-02 DIAGNOSIS — L309 Dermatitis, unspecified: Secondary | ICD-10-CM

## 2016-11-02 DIAGNOSIS — B373 Candidiasis of vulva and vagina: Secondary | ICD-10-CM | POA: Diagnosis not present

## 2016-11-02 DIAGNOSIS — B3731 Acute candidiasis of vulva and vagina: Secondary | ICD-10-CM

## 2016-11-02 MED ORDER — BETAMETHASONE VALERATE 0.1 % EX OINT
TOPICAL_OINTMENT | CUTANEOUS | 0 refills | Status: DC
Start: 2016-11-02 — End: 2017-07-04

## 2016-11-02 MED ORDER — FLUCONAZOLE 150 MG PO TABS: ORAL_TABLET | ORAL | 0 refills | Status: DC

## 2016-11-02 NOTE — Progress Notes (Signed)
GYNECOLOGY  VISIT   HPI: 21 y.o.   Single  Caucasian  female   G0P0 with Patient's last menstrual period was 10/13/2016.   here for vaginal discharge and itching x 1 week. The d/c is thick greenish, yellow. The itching is intermittently severe. She was recently on antibiotics, had the flu and Mono in February. Feeling better now. She feels irritated in the perianal area, was having diarrhea, that's better.   She is in [place].   GYNECOLOGIC HISTORY: Patient's last menstrual period was 10/13/2016. Contraception: OCP Menopausal hormone therapy: none        OB History    Gravida Para Term Preterm AB Living   0             SAB TAB Ectopic Multiple Live Births                     Patient Active Problem List   Diagnosis Date Noted  . Influenza-like illness 10/07/2016  . Hypertriglyceridemia 07/26/2016  . Annual physical exam 07/25/2016  . Basal cell carcinoma 03/24/2016  . Globus sensation 03/24/2015  . 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 Other general medical examination for administrative purposes 01/10/2012    Past Medical History:  Diagnosis Date  . Basal cell carcinoma ~ 02/2015   Scalp / removed  . 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 INGUINAL HERNIA REPAIR    . SKIN BIOPSY  5/16  . TYMPANOSTOMY TUBE PLACEMENT    . WISDOM TOOTH EXTRACTION  2014    Current Outpatient Prescriptions  Medication Sig Dispense Refill  . Multiple Vitamin (MULTIVITAMIN) tablet Take 1 tablet by mouth daily.    . Norgestimate-Ethinyl Estradiol Triphasic (TRI-SPRINTEC) 0.18/0.215/0.25 MG-35 MCG tablet Take 1 tablet by mouth daily. 84 tablet 4   No current facility-administered medications for this visit.      ALLERGIES: Patient has no known allergies.  Family History  Problem Relation Age of Onset  . Cancer Mother   . Breast  cancer Mother   . Other Mother     h/o adenomyosis  . Varicose Veins Mother   . Clotting disorder Brother   . Diabetes Maternal Grandmother   . Hypertension Maternal Grandmother   . Other Maternal Grandmother     stone in pancreas  . Hypertension Maternal Grandfather   . Depression Maternal Grandfather   . Diabetes Paternal Grandfather     and on dialysis  . Heart Problems Paternal Grandfather     heart transplant  . Heart disease Paternal Grandfather     heart transplant  . Cancer Other     maternal great grandmother-Stage III, adenocarcenoma on broad ligament of fallopian tube  . Uterine cancer Other     maternal great aunt  . Cancer Other     maternal cousin-cervical or ovarian  . Other Brother     Factor XI    Social History   Social History  . Marital status: Single    Spouse name: N/A  . Number of children: N/A  . Years of education: N/A   Occupational History  . Not on file.   Social History Main Topics  . Smoking status: Never Smoker  . Smokeless tobacco: Never Used  . Alcohol use 0.0 oz/week     Comment: not regularly  .  Drug use: No  . Sexual activity: Yes    Partners: Male    Birth control/ protection: Pill   Other Topics Concern  . Not on file   Social History Narrative  . No narrative on file    Review of Systems  Constitutional: Negative.   HENT: Negative.   Eyes: Negative.   Respiratory: Negative.   Cardiovascular: Negative.   Gastrointestinal: Negative.   Musculoskeletal: Negative.   Skin: Negative.   Neurological: Negative.   Endo/Heme/Allergies: Negative.   Psychiatric/Behavioral: Negative.     PHYSICAL EXAMINATION:    BP 90/60 (BP Location: Right Arm, Patient Position: Sitting, Cuff Size: Normal)   Pulse 88   Temp 98.6 F (37 C) (Oral)   Resp 14   Ht 5\' 7"  (1.702 m)   Wt 159 lb (72.1 kg)   LMP 10/13/2016   BMI 24.90 kg/m     General appearance: alert, cooperative and appears stated age Pelvic: External genitalia:  no  lesions, + erythema              Urethra:  normal appearing urethra with no masses, tenderness or lesions              Bartholins and Skenes: normal                 Vagina: erythematous vagina with a very large amount of clumpy, yellow vaginal d/c (wiped out as much as possible)              Cervix: no lesions  Perianal skin with erythema and mild excoriation               Chaperone was present for exam.  Wet prep: no clue, no trich, +++ wbc KOH: ++++++ yeast PH: 4.5   ASSESSMENT Yeast vaginitis, severe Perianal dermatitis    PLAN Diflucan 150 mg po q 72 hours x 2 (if not completely better would call in one more diflucan) Steroid ointment for 1-2 weeks Vaseline as needed Call if not completely better in 1 week   An After Visit Summary was printed and given to the patient.

## 2016-11-02 NOTE — Patient Instructions (Signed)

## 2016-11-15 ENCOUNTER — Telehealth: Payer: Self-pay

## 2016-11-15 DIAGNOSIS — E781 Pure hyperglyceridemia: Secondary | ICD-10-CM

## 2016-11-15 NOTE — Telephone Encounter (Signed)
Orders placed.

## 2016-11-15 NOTE — Telephone Encounter (Signed)
Pt would like to have just her Triglycerides rechecked. Please advise.

## 2016-11-16 NOTE — Telephone Encounter (Signed)
Left VM

## 2016-11-25 LAB — TRIGLYCERIDES: Triglycerides: 186 mg/dL — ABNORMAL HIGH (ref <150)

## 2017-03-15 ENCOUNTER — Telehealth: Payer: Self-pay | Admitting: Obstetrics & Gynecology

## 2017-03-15 MED ORDER — FLUCONAZOLE 150 MG PO TABS: ORAL_TABLET | ORAL | 0 refills | Status: DC

## 2017-03-15 NOTE — Telephone Encounter (Signed)
Spoke with patient, advised as seen below per Dr. Talbert Nan. [facility] [place], [place]. Patient verbalizes understanding and is agreeable.   Patient is agreeable to disposition. Will close encounter.

## 2017-03-15 NOTE — Telephone Encounter (Signed)
Please call in diflucan 150 mg po x 1, repeat x 1 in 72 hours if still symptomatic.

## 2017-03-15 NOTE — Telephone Encounter (Signed)
Spoke with patient. Patient states she is on vacation in [place] and feels she is getting a yeast infection, gets them frequently. Request RX for diflucan.  Reports thick, white cottage cheese vaginal discharge that started 2 days ago. Denies vaginal odor, itching, burning or discomfort.  Advised patient would review with covering provider, Dr. Talbert Nan, as Dr. Sabra Heck is out of the office, and return call with recommendations. Pharmacy updated. Patient is agreeable.   Dr. Talbert Nan , please advise on RX for diflucan?  Cc: Dr. Sabra Heck

## 2017-03-15 NOTE — Telephone Encounter (Signed)
Patient called and requested to speak to the nurse. She said she is on vacation in [place], [place] and thinks she may have a yeast infection.

## 2017-04-17 ENCOUNTER — Ambulatory Visit: Payer: Federal, State, Local not specified - PPO | Admitting: Obstetrics & Gynecology

## 2017-04-17 NOTE — Progress Notes (Deleted)
21 y.o. G0P0 SingleCaucasianF here for annual exam.    No LMP recorded.          Sexually active: {yes no:314532}  The current method of family planning is OCP (estrogen/progesterone).    Exercising: {yes no:314532}  {types:19826} Smoker:  no  Health Maintenance: Pap:  Never TDaP:  01/2007 Hep C testing: never Screening Labs: ***, Hb today: ***, Urine today: ***   reports that she has never smoked. She has never used smokeless tobacco. She reports that she drinks alcohol. She reports that she does not use drugs.  Past Medical History:  Diagnosis Date  . Basal cell carcinoma ~ 02/2015   Scalp / removed  . 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 INGUINAL HERNIA REPAIR    . SKIN BIOPSY  5/16  . TYMPANOSTOMY TUBE PLACEMENT    . WISDOM TOOTH EXTRACTION  2014    Current Outpatient Prescriptions  Medication Sig Dispense Refill  . betamethasone valerate ointment (VALISONE) 0.1 % Apply a pea sized amount topically BID for 1-2 weeks 15 g 0  . fluconazole (DIFLUCAN) 150 MG tablet Take one tablet.  Repeat in 72 hours 2 tablet 0  . Multiple Vitamin (MULTIVITAMIN) tablet Take 1 tablet by mouth daily.    . Norgestimate-Ethinyl Estradiol Triphasic (TRI-SPRINTEC) 0.18/0.215/0.25 MG-35 MCG tablet Take 1 tablet by mouth daily. 84 tablet 4   No current facility-administered medications for this visit.     Family History  Problem Relation Age of Onset  . Cancer Mother   . Breast cancer Mother   . Other Mother        h/o adenomyosis  . Varicose Veins Mother   . Clotting disorder Brother   . Diabetes Maternal Grandmother   . Hypertension Maternal Grandmother   . Other Maternal Grandmother        stone in pancreas  . Hypertension Maternal Grandfather   . Depression Maternal Grandfather   . Diabetes Paternal Grandfather        and on dialysis   . Heart Problems Paternal Grandfather        heart transplant  . Heart disease Paternal Grandfather        heart transplant  . Cancer Other        maternal great grandmother-Stage III, adenocarcenoma on broad ligament of fallopian tube  . Uterine cancer Other        maternal great aunt  . Cancer Other        maternal cousin-cervical or ovarian  . Other Brother        Factor XI    ROS:  Pertinent items are noted in HPI.  Otherwise, a comprehensive ROS was negative.  Exam:   There were no vitals taken for this visit.  Weight change: @WEIGHTCHANGE @ Height:      Ht Readings from Last 3 Encounters:  11/02/16 5\' 7"  (1.702 m)  07/25/16 5' 7.5" (1.715 m)  03/24/16 5' 7.5" (1.715 m)    General appearance: alert, cooperative and appears stated age Head: Normocephalic, without obvious abnormality, atraumatic Neck: no adenopathy, supple, symmetrical, trachea midline and thyroid {EXAM; THYROID:18604} Lungs: clear to auscultation bilaterally Breasts: {Exam; breast:13139::"normal appearance, no masses or tenderness"} Heart: regular rate and rhythm Abdomen: soft, non-tender; bowel sounds normal; no masses,  no organomegaly Extremities: extremities normal, atraumatic, no cyanosis or  edema Skin: Skin color, texture, turgor normal. No rashes or lesions Lymph nodes: Cervical, supraclavicular, and axillary nodes normal. No abnormal inguinal nodes palpated Neurologic: Grossly normal   Pelvic: External genitalia:  no lesions              Urethra:  normal appearing urethra with no masses, tenderness or lesions              Bartholins and Skenes: normal                 Vagina: normal appearing vagina with normal color and discharge, no lesions              Cervix: {exam; cervix:14595}              Pap taken: {yes no:314532} Bimanual Exam:  Uterus:  {exam; uterus:12215}              Adnexa: {exam; adnexa:12223}               Rectovaginal: Confirms               Anus:  normal sphincter tone, no  lesions  Chaperone was present for exam.  A:  Well Woman with normal exam  P:   {plan; gyn:5269::"mammogram","pap smear","return annually or prn"}

## 2017-04-18 ENCOUNTER — Ambulatory Visit: Payer: Federal, State, Local not specified - PPO | Admitting: Obstetrics & Gynecology

## 2017-04-24 ENCOUNTER — Encounter: Payer: Self-pay | Admitting: Obstetrics & Gynecology

## 2017-04-24 ENCOUNTER — Ambulatory Visit: Payer: Federal, State, Local not specified - PPO | Admitting: Obstetrics & Gynecology

## 2017-04-26 ENCOUNTER — Other Ambulatory Visit: Payer: Self-pay | Admitting: Obstetrics & Gynecology

## 2017-04-26 NOTE — Telephone Encounter (Signed)
Medication refill request: OCP  Last AEX:  03-24-16 Next AEX: 07-11-17  Last MMG (if hormonal medication request): N/A  Refill authorized: please advise    Sending to JJ since SM is out of the office

## 2017-05-03 ENCOUNTER — Telehealth: Payer: Self-pay | Admitting: Obstetrics & Gynecology

## 2017-05-03 NOTE — Telephone Encounter (Signed)
Left message to ask for Star[place].

## 2017-06-23 ENCOUNTER — Ambulatory Visit: Payer: Federal, State, Local not specified - PPO | Admitting: Obstetrics & Gynecology

## 2017-07-03 ENCOUNTER — Other Ambulatory Visit: Payer: Self-pay

## 2017-07-03 DIAGNOSIS — D682 Hereditary deficiency of other clotting factors: Secondary | ICD-10-CM

## 2017-07-04 ENCOUNTER — Ambulatory Visit (HOSPITAL_BASED_OUTPATIENT_CLINIC_OR_DEPARTMENT_OTHER): Payer: Federal, State, Local not specified - PPO | Admitting: Family

## 2017-07-04 ENCOUNTER — Encounter: Payer: Self-pay | Admitting: Family

## 2017-07-04 ENCOUNTER — Other Ambulatory Visit (HOSPITAL_BASED_OUTPATIENT_CLINIC_OR_DEPARTMENT_OTHER): Payer: Federal, State, Local not specified - PPO

## 2017-07-04 VITALS — BP 101/62 | HR 83 | Temp 98.1°F | Resp 17 | Wt 161.0 lb

## 2017-07-04 DIAGNOSIS — D691 Qualitative platelet defects: Secondary | ICD-10-CM

## 2017-07-04 DIAGNOSIS — D682 Hereditary deficiency of other clotting factors: Secondary | ICD-10-CM | POA: Diagnosis not present

## 2017-07-04 DIAGNOSIS — D681 Hereditary factor XI deficiency: Secondary | ICD-10-CM

## 2017-07-04 LAB — CBC WITH DIFFERENTIAL (CANCER CENTER ONLY)
BASO#: 0 10*3/uL (ref 0.0–0.2)
BASO%: 0.2 % (ref 0.0–2.0)
EOS ABS: 0.1 10*3/uL (ref 0.0–0.5)
EOS%: 2.1 % (ref 0.0–7.0)
HEMATOCRIT: 35.1 % (ref 34.8–46.6)
HEMOGLOBIN: 11.7 g/dL (ref 11.6–15.9)
LYMPH#: 2.9 10*3/uL (ref 0.9–3.3)
LYMPH%: 49.9 % — ABNORMAL HIGH (ref 14.0–48.0)
MCH: 29.8 pg (ref 26.0–34.0)
MCHC: 33.3 g/dL (ref 32.0–36.0)
MCV: 90 fL (ref 81–101)
MONO#: 0.4 10*3/uL (ref 0.1–0.9)
MONO%: 6.5 % (ref 0.0–13.0)
NEUT%: 41.3 % (ref 39.6–80.0)
NEUTROS ABS: 2.4 10*3/uL (ref 1.5–6.5)
Platelets: 267 10*3/uL (ref 145–400)
RBC: 3.92 10*6/uL (ref 3.70–5.32)
RDW: 12.7 % (ref 11.1–15.7)
WBC: 5.8 10*3/uL (ref 3.9–10.0)

## 2017-07-04 NOTE — Progress Notes (Signed)
Hematology and Oncology Follow Up Visit  Veronica [name] [id number] 1996-07-29 21 y.o. 07/04/2017   Principle Diagnosis:  Factor 11 deficiency  Concurrent platelet function disorder   Current Therapy:   Observation   Interim History:  Veronica [name] is here today for follow-up. We see her as needed prior to surgery for her factor 11 deficiency. She will be having her tonsils removed in late November or early December and she needs clearance prior to scheduling.  She has had no issues with bleeding, bruising or petechiae. No lymphadenopathy found on exam.  Her cycle is regular on oral contraception and not heavy.  No fever, chills, n/v, cough, rash, dizziness, SOB, chest pain, palpitations, abdominal pain or changes in bowel or bladder habits.  No swelling, tenderness, numbness or tingling in her extremities. No c/o pain.  She has maintained a good appetite and is staying well hydrated. Her weight is stable.   ECOG Performance Status: 0 - Asymptomatic  Medications:  Allergies as of 07/04/2017   No Known Allergies     Medication List        Accurate as of 07/04/17  1:29 PM. Always use your most recent med list.          betamethasone valerate ointment 0.1 % Commonly known as:  VALISONE Apply a pea sized amount topically BID for 1-2 weeks   fluconazole 150 MG tablet Commonly known as:  DIFLUCAN Take one tablet.  Repeat in 72 hours   multivitamin tablet Take 1 tablet by mouth daily.   TRI-PREVIFEM 0.18/0.215/0.25 MG-35 MCG tablet Generic drug:  Norgestimate-Ethinyl Estradiol Triphasic TAKE 1 TABLET BY MOUTH EVERY DAY       Allergies: No Known Allergies  Past Medical History, Surgical history, Social history, and Family History were reviewed and updated.  Review of Systems: All other 10 point review of systems is negative.   Physical Exam:  vitals were not taken for this visit.   Wt Readings from Last 3 Encounters:  11/02/16 159 lb (72.1 kg)  10/19/16  159 lb (72.1 kg)  10/07/16 160 lb 14.4 oz (73 kg)    Ocular: Sclerae unicteric, pupils equal, round and reactive to light Ear-nose-throat: Oropharynx clear, dentition fair Lymphatic: No cervical, supraclavicular or axillary adenopathy Lungs no rales or rhonchi, good excursion bilaterally Heart regular rate and rhythm, no murmur appreciated Abd soft, nontender, positive bowel sounds, no liver or spleen tip palpated on exam, no fluid wave  MSK no focal spinal tenderness, no joint edema Neuro: non-focal, well-oriented, appropriate affect Breasts: Deferred   Lab Results  Component Value Date   WBC 5.8 07/04/2017   HGB 11.7 07/04/2017   HCT 35.1 07/04/2017   MCV 90 07/04/2017   PLT 267 07/04/2017   No results found for: FERRITIN, IRON, TIBC, UIBC, IRONPCTSAT Lab Results  Component Value Date   RBC 3.92 07/04/2017   No results found for: KPAFRELGTCHN, LAMBDASER, KAPLAMBRATIO No results found for: IGGSERUM, IGA, IGMSERUM No results found for: Ronnald Ramp, A1GS, A2GS, Violet Baldy, MSPIKE, SPEI   Chemistry      Component Value Date/Time   NA 138 07/25/2016 1057   K 4.6 07/25/2016 1057   CL 102 07/25/2016 1057   CO2 27 07/25/2016 1057   BUN 9 07/25/2016 1057   CREATININE 0.88 07/25/2016 1057      Component Value Date/Time   CALCIUM 9.3 07/25/2016 1057   ALKPHOS 55 07/25/2016 1057   AST 18 07/25/2016 1057   ALT 9 07/25/2016 1057  BILITOT 0.3 07/25/2016 1057      Impression and Plan: Veronica [name] is a very pleasant 21 yo caucasian female with factor 11 deficiency and concurrent platelet function disorder. She has had frequent pharyngitis and her PCP is now recommending a tonsillectomy. She has had no issues with bleeding or bruising. No petechiae.  We will recheck her lab work and see what intervention she may require prior to and after surgery.  When we last saw her she was treated with Amicar 2 hours before and then every 6 hours  for 4 days after.    We will contact her once her results are available and also forward them to her surgeon Dr. Alycia Rossetti with [facility] in [place].  We will also schedule a follow-up for the week after surgery once the procedure is schedule.  She will contact our office with any questions or concerns. We can certainly see her sooner if need be.   Eliezer Bottom, NP 11/6/20181:29 PM

## 2017-07-05 LAB — THROMBIN TIME: THROMBIN TIME: 16.3 s (ref 0.0–23.0)

## 2017-07-05 LAB — VON WILLEBRAND PANEL
FACTOR VIII ACTIVITY: 108 % (ref 57–163)
VON WILLEBRAND AG: 110 % (ref 50–200)
VON WILLEBRAND FACTOR: 115 % (ref 50–200)

## 2017-07-05 LAB — FACTOR 9 ASSAY: Factor IX Activity: 97 % (ref 60–177)

## 2017-07-05 LAB — FACTOR 11 ASSAY: Factor XI Activity: 36 % — ABNORMAL LOW (ref 60–150)

## 2017-07-05 LAB — APTT: aPTT: 34 s — ABNORMAL HIGH (ref 24–33)

## 2017-07-06 NOTE — Addendum Note (Signed)
Addended by: Eliezer Bottom on: 07/06/2017 11:36 AM   Modules accepted: Level of Service

## 2017-07-11 ENCOUNTER — Ambulatory Visit (INDEPENDENT_AMBULATORY_CARE_PROVIDER_SITE_OTHER): Payer: Federal, State, Local not specified - PPO | Admitting: Obstetrics & Gynecology

## 2017-07-11 ENCOUNTER — Other Ambulatory Visit: Payer: Self-pay

## 2017-07-11 ENCOUNTER — Other Ambulatory Visit (HOSPITAL_COMMUNITY)
Admission: RE | Admit: 2017-07-11 | Discharge: 2017-07-11 | Disposition: A | Discharge status: Home / Self Care | Payer: Federal, State, Local not specified - PPO | Source: Ambulatory Visit | Attending: Obstetrics & Gynecology | Admitting: Obstetrics & Gynecology

## 2017-07-11 ENCOUNTER — Encounter: Payer: Self-pay | Admitting: Obstetrics & Gynecology

## 2017-07-11 VITALS — BP 106/54 | HR 64 | Resp 14 | Ht 67.0 in | Wt 161.0 lb

## 2017-07-11 DIAGNOSIS — E781 Pure hyperglyceridemia: Secondary | ICD-10-CM | POA: Diagnosis not present

## 2017-07-11 DIAGNOSIS — Z23 Encounter for immunization: Secondary | ICD-10-CM

## 2017-07-11 DIAGNOSIS — Z202 Contact with and (suspected) exposure to infections with a predominantly sexual mode of transmission: Secondary | ICD-10-CM | POA: Diagnosis not present

## 2017-07-11 DIAGNOSIS — N898 Other specified noninflammatory disorders of vagina: Secondary | ICD-10-CM

## 2017-07-11 DIAGNOSIS — Z01419 Encounter for gynecological examination (general) (routine) without abnormal findings: Secondary | ICD-10-CM | POA: Diagnosis not present

## 2017-07-11 DIAGNOSIS — Z124 Encounter for screening for malignant neoplasm of cervix: Secondary | ICD-10-CM | POA: Diagnosis not present

## 2017-07-11 MED ORDER — NORGESTIM-ETH ESTRAD TRIPHASIC 0.18/0.215/0.25 MG-35 MCG PO TABS: 1.0000 | ORAL_TABLET | Freq: Every day | ORAL | 4 refills | Status: DC

## 2017-07-11 MED ORDER — FLUCONAZOLE 150 MG PO TABS: 150.0000 mg | ORAL_TABLET | Freq: Once | ORAL | 0 refills | Status: AC

## 2017-07-11 NOTE — Progress Notes (Signed)
21 y.o. G0P0 SingleCaucasianF here for annual exam.  Doing well.  Cycles are regular.  Heavy flow one to two days.  No concerns.  Dating a person in the [place].  Been together six months.  Does have a little vaginal discharge she would like evaluated.  Patient's last menstrual period was 06/19/2017.          Sexually active: Yes.    The current method of family planning is OCP (estrogen/progesterone).    Exercising: Yes.    cardio, weight lifting Smoker:  no  Health Maintenance: Pap:  Never  TDaP:  2008 Gardasil: Completed 2008 Screening Labs: Here today    reports that  has never smoked. she has never used smokeless tobacco. She reports that she drinks alcohol. She reports that she does not use drugs.  Past Medical History:  Diagnosis Date  . Basal cell carcinoma ~ 02/2015   Scalp / removed  . Bleeding disorder ([place])    h/o platelet dysfunction  . 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 Platelet dysfunction [facility])     Past Surgical History:  Procedure Laterality Date  . INGUINAL HERNIA REPAIR    . SKIN BIOPSY  5/16  . TYMPANOSTOMY TUBE PLACEMENT    . WISDOM TOOTH EXTRACTION  2014    Current Outpatient Medications  Medication Sig Dispense Refill  . Biotin 1 MG CAPS Take daily by mouth.    . magnesium 30 MG tablet Take 30 mg 2 (two) times daily by mouth.    . Multiple Vitamin (MULTIVITAMIN) tablet Take 1 tablet by mouth daily.    . TRI-PREVIFEM 0.18/0.215/0.25 MG-35 MCG tablet TAKE 1 TABLET BY MOUTH EVERY DAY 84 tablet 0   No current facility-administered medications for this visit.     Family History  Problem Relation Age of Onset  . Cancer Mother   . Breast cancer Mother   . Other Mother        h/o adenomyosis  . Varicose Veins Mother   . Clotting disorder Brother   . Diabetes Maternal Grandmother   . Hypertension Maternal Grandmother   . Other Maternal Grandmother        stone in pancreas  . Hypertension Maternal Grandfather   . Depression Maternal  Grandfather   . Diabetes Paternal Grandfather        and on dialysis  . Heart Problems Paternal Grandfather        heart transplant  . Heart disease Paternal Grandfather        heart transplant  . Cancer Other        maternal great grandmother-Stage III, adenocarcenoma on broad ligament of fallopian tube  . Uterine cancer Other        maternal great aunt  . Cancer Other        maternal cousin-cervical or ovarian  . Other Brother        Factor XI    ROS:  Pertinent items are noted in HPI.  Otherwise, a comprehensive ROS was negative.  Exam:   BP (!) 106/54 (BP Location: Right Arm, Patient Position: Sitting, Cuff Size: Normal)   Pulse 64   Resp 14   Ht 5\' 7"  (1.702 m)   Wt 161 lb (73 kg)   LMP 06/19/2017   BMI 25.22 kg/m     Height: 5\' 7"  (170.2 cm)  Ht Readings from Last 3 Encounters:  07/11/17 5\' 7"  (1.702 m)  11/02/16 5\' 7"  (1.702 m)  07/25/16 5' 7.5" (1.715  m)    General appearance: alert, cooperative and appears stated age Head: Normocephalic, without obvious abnormality, atraumatic Neck: no adenopathy, supple, symmetrical, trachea midline and thyroid normal to inspection and palpation Lungs: clear to auscultation bilaterally Breasts: normal appearance, no masses or tenderness Heart: regular rate and rhythm Abdomen: soft, non-tender; bowel sounds normal; no masses,  no organomegaly Extremities: extremities normal, atraumatic, no cyanosis or edema Skin: Skin color, texture, turgor normal. No rashes or lesions Lymph nodes: Cervical, supraclavicular, and axillary nodes normal. No abnormal inguinal nodes palpated Neurologic: Grossly normal   Pelvic: External genitalia:  no lesions              Urethra:  normal appearing urethra with no masses, tenderness or lesions              Bartholins and Skenes: normal                 Vagina: normal appearing vagina with normal color, white adherent discharge noted              Cervix: no lesions              Pap taken: Yes.    Bimanual Exam:  Uterus:  normal size, contour, position, consistency, mobility, non-tender              Adnexa: normal adnexa and no mass, fullness, tenderness               Rectovaginal: Confirms               Anus:  normal sphincter tone, no lesions  Chaperone was present for exam.  A:  Well Woman with normal exam SA, on OCPs Factor XI deficiency.  Tonsillectomy being planned.  Waiting on recommendations from hematology H/O elevated triglycerides Vaginal d/c c/w yeast vaginitis  P:   Mammogram guidelines reviewed pap smear obtained today GC/Chl, RPR, HIV obtained Fasting lipid panel obtained today Tdap updated Rx for Tri-previfem given for 90 day supply/4RF. Affirm pending.  Rx for Diflucan 155m po x 1, repeat 72 hours given Return annually or prn issues

## 2017-07-12 LAB — LIPID PANEL
CHOLESTEROL TOTAL: 210 mg/dL — AB (ref 100–199)
Chol/HDL Ratio: 2.8 ratio (ref 0.0–4.4)
HDL: 75 mg/dL (ref >39)
LDL Calculated: 92 mg/dL (ref 0–99)
Triglycerides: 215 mg/dL — ABNORMAL HIGH (ref 0–149)
VLDL Cholesterol Cal: 43 mg/dL — ABNORMAL HIGH (ref 5–40)

## 2017-07-12 LAB — VAGINITIS/VAGINOSIS, DNA PROBE
CANDIDA SPECIES: POSITIVE — AB
GARDNERELLA VAGINALIS: POSITIVE — AB
Trichomonas vaginosis: NEGATIVE

## 2017-07-12 LAB — RPR: RPR Ser Ql: NONREACTIVE

## 2017-07-12 LAB — HIV ANTIBODY (ROUTINE TESTING W REFLEX): HIV SCREEN 4TH GENERATION: NONREACTIVE

## 2017-07-13 ENCOUNTER — Other Ambulatory Visit: Payer: Self-pay | Admitting: *Deleted

## 2017-07-13 LAB — CYTOLOGY - PAP
CHLAMYDIA, DNA PROBE: NEGATIVE
NEISSERIA GONORRHEA: NEGATIVE

## 2017-07-13 MED ORDER — FLUCONAZOLE 150 MG PO TABS: 150.0000 mg | ORAL_TABLET | Freq: Once | ORAL | 0 refills | Status: AC

## 2017-07-13 MED ORDER — METRONIDAZOLE 0.75 % VA GEL: 1.0000 | Freq: Every day | VAGINAL | 0 refills | Status: DC

## 2017-07-17 ENCOUNTER — Telehealth: Payer: Self-pay | Admitting: *Deleted

## 2017-07-17 NOTE — Telephone Encounter (Signed)
Notes recorded by Burnice Logan, RN on 07/17/2017 at 11:05 AM EST Left message to call Sharee Pimple at [phone]. See telephone encounter dated 07/17/17. ------  Notes recorded by Megan Salon, MD on 07/13/2017 at 6:04 PM EST Please let pt know her pap showed LGSIL. This needs to be repeated in one year. Needs 08 recall and appt in one year. Please adjust if needed. Thanks.

## 2017-07-17 NOTE — Telephone Encounter (Signed)
Spoke with patient, advised as seen below per Dr. Sabra Heck. AEX rescheduled to earlier date, 07/12/18 at 9:15am. Patient verbalizes understanding and is agreeable.   08 recall placed.   Will close encounter.

## 2017-07-19 ENCOUNTER — Other Ambulatory Visit: Payer: Self-pay | Admitting: *Deleted

## 2017-07-19 ENCOUNTER — Other Ambulatory Visit: Payer: Self-pay | Admitting: Family

## 2017-07-19 DIAGNOSIS — D6801 Von willebrand disease, type 1: Secondary | ICD-10-CM

## 2017-07-19 DIAGNOSIS — D691 Qualitative platelet defects: Secondary | ICD-10-CM

## 2017-07-19 DIAGNOSIS — D680 Von Willebrand's disease: Secondary | ICD-10-CM

## 2017-07-19 DIAGNOSIS — D681 Hereditary factor XI deficiency: Secondary | ICD-10-CM

## 2017-07-19 MED ORDER — DESMOPRESSIN ACETATE 1.5 MG/ML NA SOLN
NASAL | 1 refills | Status: DC
Start: 2017-07-19 — End: 2017-07-24

## 2017-07-19 NOTE — Progress Notes (Signed)
I spoke with Ms. Robidoux's mother and let her know the patient's factor XI activity was low at 36 and she will need 2 units of FFP 1 hour prior to her procedure. She will also need Stimate 1 spray in each nostril 30 minutes prior to the procedure and then once every 12 hours for 2 days after the procedure. She verbalized understanding and agreement with the plan. I will also give Dr. Vevelyn Royals a call and let him know the plan. I also forwarded her lab work to him.

## 2017-07-24 ENCOUNTER — Other Ambulatory Visit: Payer: Self-pay | Admitting: *Deleted

## 2017-07-24 DIAGNOSIS — D691 Qualitative platelet defects: Secondary | ICD-10-CM

## 2017-07-24 DIAGNOSIS — D681 Hereditary factor XI deficiency: Secondary | ICD-10-CM

## 2017-07-24 MED ORDER — DESMOPRESSIN ACETATE 1.5 MG/ML NA SOLN: NASAL | 1 refills | Status: DC

## 2017-07-25 ENCOUNTER — Other Ambulatory Visit: Payer: Self-pay | Admitting: *Deleted

## 2017-07-25 ENCOUNTER — Other Ambulatory Visit: Payer: Self-pay | Admitting: Family

## 2017-07-25 DIAGNOSIS — D681 Hereditary factor XI deficiency: Secondary | ICD-10-CM

## 2017-07-25 DIAGNOSIS — D691 Qualitative platelet defects: Secondary | ICD-10-CM

## 2017-07-25 MED ORDER — DESMOPRESSIN ACETATE 1.5 MG/ML NA SOLN: NASAL | 1 refills | Status: DC

## 2017-07-25 NOTE — Progress Notes (Signed)
I spoke with Deanna, RN with Dr. Jenny Reichmann Britt's office at [facility] and gave them the instructions for 2 units of FFP 1 hours prior to surgery and that the patient with have Stimate spray 1 spray in each nostril 30 minutes prior to her procedure and then evert 12 hours for 2 days after. I will also fax these instructions to their office. Encouraged them to please call with any questions or concerns.

## 2017-07-31 ENCOUNTER — Ambulatory Visit (INDEPENDENT_AMBULATORY_CARE_PROVIDER_SITE_OTHER): Payer: Federal, State, Local not specified - PPO | Admitting: Sports Medicine

## 2017-07-31 DIAGNOSIS — Z0000 Encounter for general adult medical examination without abnormal findings: Secondary | ICD-10-CM | POA: Diagnosis not present

## 2017-07-31 DIAGNOSIS — D649 Anemia, unspecified: Secondary | ICD-10-CM

## 2017-07-31 DIAGNOSIS — M24851 Other specific joint derangements of right hip, not elsewhere classified: Secondary | ICD-10-CM | POA: Diagnosis not present

## 2017-07-31 DIAGNOSIS — E781 Pure hyperglyceridemia: Secondary | ICD-10-CM

## 2017-07-31 NOTE — Progress Notes (Addendum)
  Subjective:    CC: Annual physical exam  HPI:  This is a pleasant 21 year old female, she is starting nursing school, she needs some labs drawn including Quantiferon Gold.  Otherwise she is healthy without any complaints with the exception of an on popping sensation that she has to consciously create with her right hip.  She denies any pain, symptoms have been present for months to years.  Past medical history:  Negative.  See flowsheet/record as well for more information.  Surgical history: Negative.  See flowsheet/record as well for more information.  Family history: Negative.  See flowsheet/record as well for more information.  Social history: Negative.  See flowsheet/record as well for more information.  Allergies, and medications have been entered into the medical record, reviewed, and no changes needed.    Review of Systems: No headache, visual changes, nausea, vomiting, diarrhea, constipation, dizziness, abdominal pain, skin rash, fevers, chills, night sweats, swollen lymph nodes, weight loss, chest pain, body aches, joint swelling, muscle aches, shortness of breath, mood changes, visual or auditory hallucinations.  Objective:    General: Well Developed, well nourished, and in no acute distress.  Neuro: Alert and oriented x3, extra-ocular muscles intact, sensation grossly intact. Cranial nerves II through XII are intact, motor, sensory, and coordinative functions are all intact. HEENT: Normocephalic, atraumatic, pupils equal round reactive to light, neck supple, no masses, no lymphadenopathy, thyroid nonpalpable. Oropharynx, nasopharynx, external ear canals are unremarkable. Skin: Warm and dry, no rashes noted.  Cardiac: Regular rate and rhythm, no murmurs rubs or gallops.  Respiratory: Clear to auscultation bilaterally. Not using accessory muscles, speaking in full sentences.  Abdominal: Soft, nontender, nondistended, positive bowel sounds, no masses, no organomegaly.    Musculoskeletal: Shoulder, elbow, wrist, hip, knee, ankle stable, and with full range of motion. Right hip: ROM IR: 60 Deg, ER: 60 Deg, Flexion: 120 Deg, Extension: 100 Deg, Abduction: 45 Deg, Adduction: 45 Deg Strength IR: 5/5, ER: 5/5, Flexion: 5/5, Extension: 5/5, Abduction: 5/5, Adduction: 5/5 Pelvic alignment unremarkable to inspection and palpation. Standing hip rotation and gait without trendelenburg / unsteadiness. Greater trochanter without tenderness to palpation. Patient can create a popping sensation over the greater trochanter. No tenderness over piriformis. No SI joint tenderness and normal minimal SI movement.  Impression and Recommendations:    The patient was counselled, risk factors were discussed, anticipatory guidance given.  Annual physical exam Routine physical exam as above. Checking routine blood work. 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  Hypertriglyceridemia Rechecking lipid panel, did have hypertriglyceridemia recently. If still elevated we will add some Lovaza.  Still elevated, adding fish oil, she also needs to work on a low-cholesterol diet and we can recheck in 3 months.  Right external coxa saltans Benign exam, I have recommended general hip strengthening, flexors, extensors, abductors and adductors. I have also advised her to not to use this as a party trick, return as needed.  Normocytic anemia Adding anemia panel and reticulocyte count.  ___________________________________________ Gwen Her. Dianah Field, M.D., ABFM., CAQSM. Primary Care and [place] Instructor of [employer] [facility] [employer]

## 2017-07-31 NOTE — Assessment & Plan Note (Signed)
Routine physical exam as above. Checking routine blood work. 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

## 2017-07-31 NOTE — Assessment & Plan Note (Signed)
Benign exam, I have recommended general hip strengthening, flexors, extensors, abductors and adductors. I have also advised her to not to use this as a party trick, return as needed.

## 2017-07-31 NOTE — Assessment & Plan Note (Addendum)
Rechecking lipid panel, did have hypertriglyceridemia recently. If still elevated we will add some Lovaza.  Still elevated, adding fish oil, she also needs to work on a low-cholesterol diet and we can recheck in 3 months.

## 2017-08-02 DIAGNOSIS — D649 Anemia, unspecified: Secondary | ICD-10-CM | POA: Insufficient documentation

## 2017-08-02 LAB — CBC
HCT: 32.6 % — ABNORMAL LOW (ref 35.0–45.0)
Hemoglobin: 11 g/dL — ABNORMAL LOW (ref 11.7–15.5)
MCH: 29.5 pg (ref 27.0–33.0)
MCHC: 33.7 g/dL (ref 32.0–36.0)
MCV: 87.4 fL (ref 80.0–100.0)
MPV: 9.1 fL (ref 7.5–12.5)
Platelets: 287 Thousand/uL (ref 140–400)
RBC: 3.73 Million/uL — ABNORMAL LOW (ref 3.80–5.10)
RDW: 12.8 % (ref 11.0–15.0)
WBC: 6.3 10*3/uL (ref 3.8–10.8)

## 2017-08-02 MED ORDER — OMEGA-3-ACID ETHYL ESTERS 1 G PO CAPS: 2.0000 g | ORAL_CAPSULE | Freq: Two times a day (BID) | ORAL | 3 refills | Status: DC

## 2017-08-02 NOTE — Addendum Note (Signed)
Addended by: Silverio Decamp on: 08/02/2017 11:30 AM   Modules accepted: Orders

## 2017-08-02 NOTE — Assessment & Plan Note (Signed)
Adding anemia panel and reticulocyte count.

## 2017-08-03 LAB — QUANTIFERON-TB GOLD PLUS
Mitogen-NIL: >10 [IU]/mL
NIL: 0.03 [IU]/mL
QuantiFERON-TB Gold Plus: NEGATIVE
TB1-NIL: 0 IU/mL
TB2-NIL: 0.01 [IU]/mL

## 2017-08-08 ENCOUNTER — Other Ambulatory Visit: Payer: Self-pay

## 2017-08-08 DIAGNOSIS — E781 Pure hyperglyceridemia: Secondary | ICD-10-CM

## 2017-08-08 MED ORDER — OMEGA-3-ACID ETHYL ESTERS 1 G PO CAPS: 2.0000 g | ORAL_CAPSULE | Freq: Two times a day (BID) | ORAL | 3 refills | Status: DC

## 2017-08-08 NOTE — Telephone Encounter (Signed)
Needed the Lovaza to go to Mail Order. Medication sent.

## 2017-08-09 LAB — HEMOGLOBIN A1C
Hgb A1c MFr Bld: 5 %{Hb} (ref <5.7)
Mean Plasma Glucose: 97 (calc)
eAG (mmol/L): 5.4 (calc)

## 2017-08-09 LAB — COMPREHENSIVE METABOLIC PANEL
AG Ratio: 1.3 (calc) (ref 1.0–2.5)
ALT: 9 U/L (ref 6–29)
BUN: 12 mg/dL (ref 7–25)
Glucose, Bld: 93 mg/dL (ref 65–99)
Sodium: 135 mmol/L (ref 135–146)
Total Protein: 7.4 g/dL (ref 6.1–8.1)

## 2017-08-09 LAB — ANEMIA PROFILE B
%SAT: 14 % (ref 11–50)
Ferritin: 19 ng/mL (ref 10–154)
Folate: 7 ng/mL
HCT: 32.6 % — ABNORMAL LOW (ref 35.0–45.0)
Hemoglobin: 11 g/dL — ABNORMAL LOW (ref 11.7–15.5)
Iron: 64 ug/dL (ref 40–190)
MCH: 29.5 pg (ref 27.0–33.0)
MCHC: 33.7 g/dL (ref 32.0–36.0)
MCV: 87.4 fL (ref 80.0–100.0)
MPV: 9.1 fL (ref 7.5–12.5)
Platelets: 287 10*3/uL (ref 140–400)
RBC: 3.73 10*6/uL — ABNORMAL LOW (ref 3.80–5.10)
RDW: 12.8 % (ref 11.0–15.0)
TIBC: 457 mcg/dL (calc) — ABNORMAL HIGH (ref 250–450)
Vitamin B-12: 367 pg/mL (ref 200–1100)
WBC: 6.3 Thousand/uL (ref 3.8–10.8)

## 2017-08-09 LAB — COMPREHENSIVE METABOLIC PANEL WITH GFR
AST: 16 U/L (ref 10–30)
Albumin: 4.2 g/dL (ref 3.6–5.1)
Alkaline phosphatase (APISO): 41 U/L (ref 33–115)
CO2: 27 mmol/L (ref 20–32)
Calcium: 9.5 mg/dL (ref 8.6–10.2)
Chloride: 100 mmol/L (ref 98–110)
Creat: 0.87 mg/dL (ref 0.50–1.10)
Globulin: 3.2 g/dL (ref 1.9–3.7)
Potassium: 3.6 mmol/L (ref 3.5–5.3)
Total Bilirubin: 0.4 mg/dL (ref 0.2–1.2)

## 2017-08-09 LAB — LIPID PANEL W/REFLEX DIRECT LDL
Cholesterol: 221 mg/dL — ABNORMAL HIGH (ref <200)
HDL: 70 mg/dL (ref >50)
LDL Cholesterol (Calc): 119 mg/dL (calc) — ABNORMAL HIGH
Non-HDL Cholesterol (Calc): 151 mg/dL (calc) — ABNORMAL HIGH (ref <130)
Total CHOL/HDL Ratio: 3.2 (calc) (ref <5.0)
Triglycerides: 207 mg/dL — ABNORMAL HIGH (ref <150)

## 2017-08-09 LAB — VITAMIN D 25 HYDROXY (VIT D DEFICIENCY, FRACTURES): Vit D, 25-Hydroxy: 55 ng/mL (ref 30–100)

## 2017-08-09 LAB — TSH: TSH: 2.84 mIU/L

## 2018-07-12 ENCOUNTER — Encounter: Payer: Self-pay | Admitting: Obstetrics & Gynecology

## 2018-07-12 ENCOUNTER — Other Ambulatory Visit (HOSPITAL_COMMUNITY)
Admission: RE | Admit: 2018-07-12 | Discharge: 2018-07-12 | Disposition: A | Discharge status: Home / Self Care | Payer: Federal, State, Local not specified - PPO | Source: Ambulatory Visit | Attending: Obstetrics & Gynecology | Admitting: Obstetrics & Gynecology

## 2018-07-12 ENCOUNTER — Ambulatory Visit: Payer: Federal, State, Local not specified - PPO | Admitting: Obstetrics & Gynecology

## 2018-07-12 VITALS — BP 98/60 | HR 80 | Resp 16 | Ht 67.25 in | Wt 156.8 lb

## 2018-07-12 DIAGNOSIS — Z124 Encounter for screening for malignant neoplasm of cervix: Secondary | ICD-10-CM | POA: Diagnosis not present

## 2018-07-12 DIAGNOSIS — R87612 Low grade squamous intraepithelial lesion on cytologic smear of cervix (LGSIL): Secondary | ICD-10-CM

## 2018-07-12 DIAGNOSIS — Z01419 Encounter for gynecological examination (general) (routine) without abnormal findings: Secondary | ICD-10-CM | POA: Diagnosis not present

## 2018-07-12 MED ORDER — NORGESTIM-ETH ESTRAD TRIPHASIC 0.18/0.215/0.25 MG-35 MCG PO TABS: 1.0000 | ORAL_TABLET | Freq: Every day | ORAL | 4 refills | Status: DC

## 2018-07-12 NOTE — Progress Notes (Signed)
22 y.o. G0P0 Single White or Caucasian female here for annual exam.  Just moved into an apartment this week.  Parents' home is on the market.    Has hx of Factor XI deficiency.  Was considering tonsillectomy last year (however issues have improved).  She did see Laverna Peace prior to possible procedure.  Lab work confirmed needing FFP and Stimate prior to procedure and post op Stimate as well.  Note is documented on 07/19/17.    Cycles are regular.  Flow is very light.    PCP:  Dr. Dianah Field.  Blood work was done last winter and has this scheduled for the next few weeks.    Patient's last menstrual period was 06/19/2018 (exact date).          Sexually active: Yes.    The current method of family planning is OCP (estrogen/progesterone).    Exercising: Yes.    cardio, weight lifting  Smoker:  no  Health Maintenance: Pap:  07/11/17 LSIL.  History of abnormal Pap:  yes TDaP:  2018 Gardasil: completed  Screening Labs: PCP   reports that she has never smoked. She has never used smokeless tobacco. She reports that she drinks alcohol. She reports that she does not use drugs.  Past Medical History:  Diagnosis Date  . Basal cell carcinoma ~ 02/2015   Scalp / removed  . Bleeding disorder ([place])    h/o platelet dysfunction  . Factor XI deficiency ([place])   . Platelet dysfunction [facility])     Past Surgical History:  Procedure Laterality Date  . BASAL CELL CARCINOMA EXCISION  5/16  . INGUINAL HERNIA REPAIR    . TYMPANOSTOMY TUBE PLACEMENT    . WISDOM TOOTH EXTRACTION  2014    Current Outpatient Medications  Medication Sig Dispense Refill  . Biotin 1 MG CAPS Take daily by mouth.    . Multiple Vitamin (MULTIVITAMIN) tablet Take 1 tablet by mouth daily.    . Norgestimate-Ethinyl Estradiol Triphasic (TRI-PREVIFEM) 0.18/0.215/0.25 MG-35 MCG tablet Take 1 tablet by mouth daily. 84 tablet 4  . omega-3 acid ethyl esters (LOVAZA) 1 g capsule Take 2 capsules (2 g total) by mouth 2 (two) times  daily. 360 capsule 3   No current facility-administered medications for this visit.     Family History  Problem Relation Age of Onset  . Cancer Mother   . Breast cancer Mother   . Other Mother        h/o adenomyosis  . Varicose Veins Mother   . Clotting disorder Brother        Factor XI  . Diabetes Maternal Grandmother   . Hypertension Maternal Grandmother   . Other Maternal Grandmother        pancreatic stone  . Hypertension Maternal Grandfather   . Depression Maternal Grandfather   . Diabetes Paternal Grandfather        and on dialysis  . Heart Problems Paternal Grandfather        heart transplant  . Heart disease Paternal Grandfather        heart transplant  . Cancer Other        maternal great grandmother-Stage III, adenocarcenoma on broad ligament of fallopian tube  . Uterine cancer Other        maternal great aunt  . Cancer Other        maternal cousin-cervical or ovarian    Review of Systems  All other systems reviewed and are negative.   Exam:   BP 98/60 (BP  Location: Right Arm, Patient Position: Sitting, Cuff Size: Normal)   Pulse 80   Resp 16   Ht 5' 7.25" (1.708 m)   Wt 156 lb 12.8 oz (71.1 kg)   LMP 06/19/2018 (Exact Date)   BMI 24.38 kg/m    Height: 5' 7.25" (170.8 cm)  Ht Readings from Last 3 Encounters:  07/12/18 5' 7.25" (1.708 m)  07/11/17 5\' 7"  (1.702 m)  11/02/16 5\' 7"  (1.702 m)    General appearance: alert, cooperative and appears stated age Head: Normocephalic, without obvious abnormality, atraumatic Neck: no adenopathy, supple, symmetrical, trachea midline and thyroid normal to inspection and palpation Lungs: clear to auscultation bilaterally Breasts: normal appearance, no masses or tenderness Heart: regular rate and rhythm Abdomen: soft, non-tender; bowel sounds normal; no masses,  no organomegaly Extremities: extremities normal, atraumatic, no cyanosis or edema Skin: Skin color, texture, turgor normal. No rashes or lesions Lymph  nodes: Cervical, supraclavicular, and axillary nodes normal. No abnormal inguinal nodes palpated Neurologic: Grossly normal   Pelvic: External genitalia:  no lesions              Urethra:  normal appearing urethra with no masses, tenderness or lesions              Bartholins and Skenes: normal                 Vagina: normal appearing vagina with normal color and discharge, no lesions              Cervix: no lesions              Pap taken: Yes.   Bimanual Exam:  Uterus:  normal size, contour, position, consistency, mobility, non-tender              Adnexa: normal adnexa and no mass, fullness, tenderness               Rectovaginal: Confirms               Anus:  normal sphincter tone, no lesions  Chaperone was present for exam.  A:  Well Woman with normal exam On OCPs for cycle regulation Factor XI deficiency H/O elevated triglycerides, followed by PCP LGSIL pap 2018  P:   Mammogram guidelines reviewed pap smear obtained today GC/Chl, trich obtained today RF for Tri-previfem for 3 month supply/4RF return annually or prn

## 2018-07-17 ENCOUNTER — Other Ambulatory Visit: Payer: Self-pay | Admitting: *Deleted

## 2018-07-17 LAB — CYTOLOGY - PAP
Chlamydia: POSITIVE — AB
DIAGNOSIS: NEGATIVE
Neisseria Gonorrhea: NEGATIVE
TRICH (WINDOWPATH): NEGATIVE

## 2018-07-17 MED ORDER — AZITHROMYCIN 1 G PO PACK: 1.0000 | PACK | Freq: Once | ORAL | 0 refills | Status: AC

## 2018-09-10 ENCOUNTER — Other Ambulatory Visit: Payer: Self-pay | Admitting: Obstetrics & Gynecology

## 2018-09-25 ENCOUNTER — Ambulatory Visit: Payer: Federal, State, Local not specified - PPO | Admitting: Obstetrics & Gynecology

## 2018-10-16 ENCOUNTER — Ambulatory Visit: Payer: Federal, State, Local not specified - PPO | Admitting: Obstetrics & Gynecology

## 2018-10-16 ENCOUNTER — Encounter: Payer: Self-pay | Admitting: Obstetrics & Gynecology

## 2018-10-16 ENCOUNTER — Other Ambulatory Visit: Payer: Self-pay

## 2018-10-16 VITALS — BP 96/62 | HR 84 | Resp 16 | Ht 67.25 in | Wt 154.0 lb

## 2018-10-16 DIAGNOSIS — Z8619 Personal history of other infectious and parasitic diseases: Secondary | ICD-10-CM | POA: Diagnosis not present

## 2018-10-16 DIAGNOSIS — Z202 Contact with and (suspected) exposure to infections with a predominantly sexual mode of transmission: Secondary | ICD-10-CM | POA: Diagnosis not present

## 2018-10-16 NOTE — Progress Notes (Signed)
GYNECOLOGY  VISIT  CC:   Chl TOC  HPI: 23 y.o. G0P0 Single White or Caucasian female here for Chl TOC.  Took all antibiotics and was not SA again until partner was treated.  Denies any vaginal discharge or odor.  Denies abnormal bleeding.  Denies pelvic pain or fever.  Additional STD testing recommended.  She is in agreement with doing this today.  Had interview for job in [place] at [employer] today.  Really excited about this.  GYNECOLOGIC HISTORY: Patient's last menstrual period was 10/09/2018 (exact date). Contraception: OCP Menopausal hormone therapy: none  Patient Active Problem List   Diagnosis Date Noted  . Normocytic anemia 08/02/2017  . 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 Hypertriglyceridemia 07/26/2016  . Annual physical exam 07/25/2016  . Basal cell carcinoma 03/24/2016  . 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 Other general medical examination for administrative purposes 01/10/2012    Past Medical History:  Diagnosis Date  . Basal cell carcinoma ~ 02/2015   Scalp / removed  . 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 BASAL CELL CARCINOMA EXCISION  5/16  . INGUINAL HERNIA REPAIR    . TYMPANOSTOMY TUBE PLACEMENT    . WISDOM TOOTH EXTRACTION  2014    MEDS:   Current Outpatient Medications on File Prior to Visit  Medication Sig Dispense Refill  . Biotin 1 MG CAPS Take daily by mouth.    . Multiple Vitamin (MULTIVITAMIN) tablet Take 1 tablet by mouth daily.    . Norgestimate-Ethinyl Estradiol Triphasic (TRI-PREVIFEM) 0.18/0.215/0.25 MG-35 MCG tablet Take 1 tablet by mouth daily. 84 tablet 4  . omega-3 acid ethyl esters (LOVAZA) 1 g capsule Take 2 capsules (2 g total) by mouth 2 (two) times daily. 360 capsule 3   No current facility-administered medications on file prior to visit.     ALLERGIES: Patient has no known  allergies.  Family History  Problem Relation Age of Onset  . Cancer Mother   . Breast cancer Mother   . Other Mother        h/o adenomyosis  . Varicose Veins Mother   . Clotting disorder Brother        Factor XI  . Diabetes Maternal Grandmother   . Hypertension Maternal Grandmother   . Other Maternal Grandmother        pancreatic stone  . Hypertension Maternal Grandfather   . Depression Maternal Grandfather   . Diabetes Paternal Grandfather        and on dialysis  . Heart Problems Paternal Grandfather        heart transplant  . Heart disease Paternal Grandfather        heart transplant  . Cancer Other        maternal great grandmother-Stage III, adenocarcenoma on broad ligament of fallopian tube  . Uterine cancer Other        maternal great aunt  . Cancer Other        maternal cousin-cervical or ovarian    SH:  Single, non smoker  Review of Systems  All other systems reviewed and are negative.   PHYSICAL EXAMINATION:    BP 96/62 (BP Location: Right Arm, Patient Position: Sitting, Cuff Size: Normal)   Pulse 84   Resp 16   Ht 5' 7.25" (1.708 m)   Wt 154 lb (69.9 kg)  LMP 10/09/2018 (Exact Date)   BMI 23.94 kg/m     General appearance: alert, cooperative and appears stated age Abdomen: soft, non-tender; bowel sounds normal; no masses,  no organomegaly Lymph:  no inguinal LAD noted  Pelvic: External genitalia:  no lesions              Urethra:  normal appearing urethra with no masses, tenderness or lesions              Bartholins and Skenes: normal                 Vagina: normal appearing vagina with normal color and discharge, no lesions              Cervix: no lesions              Bimanual Exam:  Uterus:  normal size, contour, position, consistency, mobility, non-tender              Adnexa: no mass, fullness, tenderness   Chaperone was present for exam.  Assessment: H/o chlamydia for repeat testing today  Plan: GC/Chl testing obtained today HIV, RPR,  Hep C obtained today.  Has received Hep B vaccinations.

## 2018-10-17 LAB — HIV ANTIBODY (ROUTINE TESTING W REFLEX): HIV Screen 4th Generation wRfx: NONREACTIVE

## 2018-10-17 LAB — HEPATITIS C ANTIBODY: Hep C Virus Ab: <0.1 s/co ratio (ref 0.0–0.9)

## 2018-10-17 LAB — RPR: RPR Ser Ql: NONREACTIVE

## 2018-10-18 LAB — GC/CHLAMYDIA PROBE AMP
Chlamydia trachomatis, NAA: NEGATIVE
Neisseria gonorrhoeae by PCR: NEGATIVE

## 2019-01-28 ENCOUNTER — Encounter: Payer: Self-pay | Admitting: Sports Medicine

## 2019-01-28 ENCOUNTER — Ambulatory Visit (INDEPENDENT_AMBULATORY_CARE_PROVIDER_SITE_OTHER): Payer: Federal, State, Local not specified - PPO | Admitting: Sports Medicine

## 2019-01-28 VITALS — BP 101/70 | HR 85 | Temp 97.9°F | Ht 67.0 in | Wt 153.0 lb

## 2019-01-28 DIAGNOSIS — Z0000 Encounter for general adult medical examination without abnormal findings: Secondary | ICD-10-CM | POA: Diagnosis not present

## 2019-01-28 DIAGNOSIS — Z23 Encounter for immunization: Secondary | ICD-10-CM | POA: Diagnosis not present

## 2019-01-28 DIAGNOSIS — E781 Pure hyperglyceridemia: Secondary | ICD-10-CM | POA: Diagnosis not present

## 2019-01-28 DIAGNOSIS — R002 Palpitations: Secondary | ICD-10-CM

## 2019-01-28 NOTE — Assessment & Plan Note (Signed)
Routine physical as above.   Checking routine labs. Up-to-date on screening measures, vaccinations with the exception of meningococcal B, we are doing this today.

## 2019-01-28 NOTE — Assessment & Plan Note (Addendum)
With fixed split S2 heart sound, as well as father with a history of a paradoxical stroke from PFO we are going to add an echo with a bubble study, as well as Holter monitoring. Occasional resting heart rates into the 120s with palpitations as well.

## 2019-01-28 NOTE — Progress Notes (Signed)
Subjective:    CC: CPE  HPI:  This is a pleasant 23 year old female, she is on her third attempt at nursing school, she is here for her physical.  Her only complaint is occasional palpitations, and resting heart rates in the 120s.  Symptoms are moderate, persistent, not exertional, no chest pain, no shortness of breath.  Her father does have a history of a PFO with paradoxical stroke.  I reviewed the past medical history, family history, social history, surgical history, and allergies today and no changes were needed.  Please see the problem list section below in epic for further details.  Past Medical History: Past Medical History:  Diagnosis Date  . Basal cell carcinoma ~ 02/2015   Scalp / removed  . 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 Platelet dysfunction ([facility])    Past Surgical History: Past Surgical History:  Procedure Laterality Date  . BASAL CELL CARCINOMA EXCISION  5/16  . INGUINAL HERNIA REPAIR    . TYMPANOSTOMY TUBE PLACEMENT    . WISDOM TOOTH EXTRACTION  2014   Social History: Social History   Socioeconomic History  . Marital status: Single    Spouse name: Not on file  . Number of children: Not on file  . Years of education: Not on file  . Highest education level: Not on file  Occupational History  . Not on file  Social Needs  . Financial resource strain: Not on file  . Food insecurity:    Worry: Not on file    Inability: Not on file  . Transportation needs:    Medical: Not on file    Non-medical: Not on file  Tobacco Use  . Smoking status: Never Smoker  . Smokeless tobacco: Never Used  Substance and Sexual Activity  . Alcohol use: Yes    Alcohol/week: 0.0 standard drinks    Comment: not regularly  . Drug use: No  . Sexual activity: Yes    Partners: Male    Birth control/protection: Pill  Lifestyle  . Physical activity:    Days per week: Not on file    Minutes per session: Not on file  .  Stress: Not on file  Relationships  . Social connections:    Talks on phone: Not on file    Gets together: Not on file    Attends religious service: Not on file    Active member of club or organization: Not on file    Attends meetings of clubs or organizations: Not on file    Relationship status: Not on file  Other Topics Concern  . Not on file  Social History Narrative  . Not on file   Family History: Family History  Problem Relation Age of Onset  . Cancer Mother   . Breast cancer Mother   . Other Mother        h/o adenomyosis  . Varicose Veins Mother   . Clotting disorder Brother        Factor XI  . Diabetes Maternal Grandmother   . Hypertension Maternal Grandmother   . Other Maternal Grandmother        pancreatic stone  . Hypertension Maternal Grandfather   . Depression Maternal Grandfather   . Diabetes Paternal Grandfather        and on dialysis  . Heart Problems Paternal Grandfather        heart transplant  . Heart disease Paternal Grandfather  heart transplant  . Cancer Other        maternal great grandmother-Stage III, adenocarcenoma on broad ligament of fallopian tube  . Uterine cancer Other        maternal great aunt  . Cancer Other        maternal cousin-cervical or ovarian   Allergies: No Known Allergies Medications: See med rec.  Review of Systems: No headache, visual changes, nausea, vomiting, diarrhea, constipation, dizziness, abdominal pain, skin rash, fevers, chills, night sweats, swollen lymph nodes, weight loss, chest pain, body aches, joint swelling, muscle aches, shortness of breath, mood changes, visual or auditory hallucinations.  Objective:    General: Well Developed, well nourished, and in no acute distress.  Neuro: Alert and oriented x3, extra-ocular muscles intact, sensation grossly intact. Cranial nerves II through XII are intact, motor, sensory, and coordinative functions are all intact. HEENT: Normocephalic, atraumatic, pupils  equal round reactive to light, neck supple, no masses, no lymphadenopathy, thyroid nonpalpable. Oropharynx, nasopharynx, external ear canals are unremarkable. Skin: Warm and dry, no rashes noted.  Cardiac: Regular rate and rhythm, no murmurs rubs, thick split S2.  Respiratory: Clear to auscultation bilaterally. Not using accessory muscles, speaking in full sentences.  Abdominal: Soft, nontender, nondistended, positive bowel sounds, no masses, no organomegaly.  Musculoskeletal: Shoulder, elbow, wrist, hip, knee, ankle stable, and with full range of motion.  Impression and Recommendations:    The patient was counselled, risk factors were discussed, anticipatory guidance given.  Annual physical exam Routine physical as above.   Checking routine labs. Up-to-date on screening measures, vaccinations with the exception of meningococcal B, we are doing this today.  Palpitations With fixed split S2 heart sound, as well as father with a history of a paradoxical stroke from PFO we are going to add an echo with a bubble study, as well as Holter monitoring. Occasional resting heart rates into the 120s with palpitations as well.   ___________________________________________ Gwen Her. Dianah Field, M.D., ABFM., CAQSM. Primary Care and Sports Medicine [facility]  Adjunct Professor of [place] [employer] [facility] [employer]

## 2019-01-30 ENCOUNTER — Telehealth: Payer: Self-pay | Admitting: Radiology

## 2019-01-30 LAB — TSH: TSH: 2.16 mIU/L

## 2019-01-30 LAB — QUANTIFERON-TB GOLD PLUS
Mitogen-NIL: 9.07 IU/mL
NIL: 0.02 IU/mL
QuantiFERON-TB Gold Plus: NEGATIVE
TB1-NIL: 0 IU/mL
TB2-NIL: 0 IU/mL

## 2019-01-30 LAB — LIPID PANEL W/REFLEX DIRECT LDL
Cholesterol: 225 mg/dL — ABNORMAL HIGH (ref <200)
HDL: 77 mg/dL (ref >=50)
LDL Cholesterol (Calc): 111 mg/dL (calc) — ABNORMAL HIGH
Non-HDL Cholesterol (Calc): 148 mg/dL (calc) — ABNORMAL HIGH (ref <130)
Total CHOL/HDL Ratio: 2.9 (calc) (ref <5.0)
Triglycerides: 258 mg/dL — ABNORMAL HIGH (ref <150)

## 2019-01-30 LAB — HEMOGLOBIN A1C
Hgb A1c MFr Bld: 5 % of total Hgb (ref <5.7)
Mean Plasma Glucose: 97 (calc)
eAG (mmol/L): 5.4 (calc)

## 2019-01-30 LAB — CBC
HCT: 38.7 % (ref 35.0–45.0)
Hemoglobin: 12.8 g/dL (ref 11.7–15.5)
MCH: 28.8 pg (ref 27.0–33.0)
MCHC: 33.1 g/dL (ref 32.0–36.0)
MCV: 87.2 fL (ref 80.0–100.0)
MPV: 9.5 fL (ref 7.5–12.5)
Platelets: 283 10*3/uL (ref 140–400)
RBC: 4.44 10*6/uL (ref 3.80–5.10)
RDW: 12.5 % (ref 11.0–15.0)
WBC: 5.5 10*3/uL (ref 3.8–10.8)

## 2019-01-30 LAB — COMPLETE METABOLIC PANEL WITH GFR
AG Ratio: 1.3 (calc) (ref 1.0–2.5)
ALT: 8 U/L (ref 6–29)
AST: 18 U/L (ref 10–30)
Albumin: 4.3 g/dL (ref 3.6–5.1)
Alkaline phosphatase (APISO): 43 U/L (ref 31–125)
BUN: 11 mg/dL (ref 7–25)
CO2: 26 mmol/L (ref 20–32)
Calcium: 9.3 mg/dL (ref 8.6–10.2)
Chloride: 101 mmol/L (ref 98–110)
Creat: 0.79 mg/dL (ref 0.50–1.10)
GFR, Est African American: 122 mL/min/{1.73_m2} (ref >=60)
GFR, Est Non African American: 106 mL/min/{1.73_m2} (ref >=60)
Globulin: 3.3 g/dL (calc) (ref 1.9–3.7)
Glucose, Bld: 81 mg/dL (ref 65–99)
Potassium: 4.7 mmol/L (ref 3.5–5.3)
Sodium: 136 mmol/L (ref 135–146)
Total Bilirubin: 0.4 mg/dL (ref 0.2–1.2)
Total Protein: 7.6 g/dL (ref 6.1–8.1)

## 2019-01-30 LAB — VITAMIN D 25 HYDROXY (VIT D DEFICIENCY, FRACTURES): Vit D, 25-Hydroxy: 49 ng/mL (ref 30–100)

## 2019-01-30 NOTE — Telephone Encounter (Signed)
Enrolled patient for a 14 Day Zio long Term monitor to be mailed. Brief instructions were gone over and patient knows to expect the monitor to arrive in 3-4 days

## 2019-01-31 ENCOUNTER — Encounter: Payer: Self-pay | Admitting: Sports Medicine

## 2019-01-31 DIAGNOSIS — E781 Pure hyperglyceridemia: Secondary | ICD-10-CM

## 2019-01-31 MED ORDER — OMEGA-3-ACID ETHYL ESTERS 1 G PO CAPS: 2.0000 g | ORAL_CAPSULE | Freq: Two times a day (BID) | ORAL | 3 refills | Status: DC

## 2019-01-31 NOTE — Assessment & Plan Note (Signed)
Not really taking her Lovaza which explains the hypertriglyceridemia. Refilling this medication, we can recheck in 3 months.

## 2019-02-05 ENCOUNTER — Ambulatory Visit (INDEPENDENT_AMBULATORY_CARE_PROVIDER_SITE_OTHER): Payer: Federal, State, Local not specified - PPO

## 2019-02-05 DIAGNOSIS — R002 Palpitations: Secondary | ICD-10-CM | POA: Diagnosis not present

## 2019-02-15 ENCOUNTER — Other Ambulatory Visit: Payer: Self-pay

## 2019-02-15 ENCOUNTER — Ambulatory Visit (HOSPITAL_BASED_OUTPATIENT_CLINIC_OR_DEPARTMENT_OTHER)
Admission: RE | Admit: 2019-02-15 | Discharge: 2019-02-15 | Disposition: A | Discharge status: Home / Self Care | Payer: Federal, State, Local not specified - PPO | Source: Ambulatory Visit | Attending: Sports Medicine | Admitting: Sports Medicine

## 2019-02-15 DIAGNOSIS — I361 Nonrheumatic tricuspid (valve) insufficiency: Secondary | ICD-10-CM

## 2019-02-15 DIAGNOSIS — R002 Palpitations: Secondary | ICD-10-CM | POA: Insufficient documentation

## 2019-02-15 NOTE — Progress Notes (Signed)
  Echocardiogram 2D Echocardiogram has been performed.  Veronica [name] 02/15/2019, 3:29 PM

## 2019-02-27 ENCOUNTER — Other Ambulatory Visit: Payer: Self-pay

## 2019-04-25 ENCOUNTER — Encounter: Payer: Self-pay | Admitting: Sports Medicine

## 2019-04-25 ENCOUNTER — Ambulatory Visit (INDEPENDENT_AMBULATORY_CARE_PROVIDER_SITE_OTHER): Payer: Federal, State, Local not specified - PPO | Admitting: Sports Medicine

## 2019-04-25 ENCOUNTER — Other Ambulatory Visit: Payer: Self-pay

## 2019-04-25 DIAGNOSIS — R41840 Attention and concentration deficit: Secondary | ICD-10-CM

## 2019-04-25 NOTE — Progress Notes (Signed)
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  Symptoms are moderate, persistent.  She denies any absentmindedness or easy distractibility with other tasks.  She denies any suicidal or homicidal ideation, mild anxiety and depressive symptoms.  I reviewed the past medical history, family history, social history, surgical history, and allergies today and no changes were needed.  Please see the problem list section below in epic for further details.  Past Medical History: Past Medical History:  Diagnosis Date  . Basal cell carcinoma ~ 02/2015   Scalp / removed  . 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 Platelet dysfunction ([facility])    Past Surgical History: Past Surgical History:  Procedure Laterality Date  . BASAL CELL CARCINOMA EXCISION  5/16  . INGUINAL HERNIA REPAIR    . TYMPANOSTOMY TUBE PLACEMENT    . WISDOM TOOTH EXTRACTION  2014   Social History: Social History   Socioeconomic History  . Marital status: Single    Spouse name: Not on file  . Number of children: Not on file  . Years of education: Not on file  . Highest education level: Not on file  Occupational History  . Not on file  Social Needs  . Financial resource strain: Not on file  . Food insecurity    Worry: Not on file    Inability: Not on file  . Transportation needs    Medical: Not on file    Non-medical: Not on file  Tobacco Use  . Smoking status: Never Smoker  . Smokeless tobacco: Never Used  Substance and Sexual Activity  . Alcohol use: Yes    Alcohol/week: 0.0 standard drinks    Comment: not regularly  . Drug use: No  . Sexual activity: Yes    Partners: Male    Birth  control/protection: Pill  Lifestyle  . Physical activity    Days per week: Not on file    Minutes per session: Not on file  . Stress: Not on file  Relationships  . Social Herbalist on phone: Not on file    Gets together: Not on file    Attends religious service: Not on file    Active member of club or organization: Not on file    Attends meetings of clubs or organizations: Not on file    Relationship status: Not on file  Other Topics Concern  . Not on file  Social History Narrative  . Not on file   Family History: Family History  Problem Relation Age of Onset  . Cancer Mother   . Breast cancer Mother   . Other Mother        h/o adenomyosis  . Varicose Veins Mother   . Clotting disorder Brother        Factor XI  . Diabetes Maternal Grandmother   . Hypertension Maternal Grandmother   . Other Maternal Grandmother        pancreatic stone  . Hypertension Maternal Grandfather   . Depression Maternal Grandfather   . Diabetes Paternal Grandfather        and on dialysis  . Heart Problems Paternal Grandfather  heart transplant  . Heart disease Paternal Grandfather        heart transplant  . Cancer Other        maternal great grandmother-Stage III, adenocarcenoma on broad ligament of fallopian tube  . Uterine cancer Other        maternal great aunt  . Cancer Other        maternal cousin-cervical or ovarian   Allergies: No Known Allergies Medications: See med rec.  Review of Systems: No fevers, chills, night sweats, weight loss, chest pain, or shortness of breath.   Objective:    General: Well Developed, well nourished, and in no acute distress.  Neuro: Alert and oriented x3, extra-ocular muscles intact, sensation grossly intact.  HEENT: Normocephalic, atraumatic, pupils equal round reactive to light, neck supple, no masses, no lymphadenopathy, thyroid nonpalpable.  Skin: Warm and dry, no rashes. Cardiac: Regular rate and rhythm, no murmurs rubs or  gallops, no lower extremity edema.  Respiratory: Clear to auscultation bilaterally. Not using accessory muscles, speaking in full sentences.  Impression and Recommendations:    Difficulty concentrating No history of ADHD, she is having some struggles in nursing school. Her poor concentration is complicated by fairly severe anxiety with regards to her tests. PHQ, GAD today. I would like a full neuropsychologic evaluation for adult ADHD. I am also to write her a note to get into a gym as this has tended to help her mood and concentration as well.   ___________________________________________ Gwen Her. Dianah Field, M.D., ABFM., CAQSM. Primary Care and Sports Medicine [facility]  Adjunct Professor of [place] [employer] [facility] [employer]

## 2019-04-25 NOTE — Assessment & Plan Note (Signed)
No history of ADHD, she is having some struggles in nursing school. Her poor concentration is complicated by fairly severe anxiety with regards to her tests. PHQ, GAD today. I would like a full neuropsychologic evaluation for adult ADHD. I am also to write her a note to get into a gym as this has tended to help her mood and concentration as well.

## 2019-07-22 ENCOUNTER — Telehealth: Payer: Self-pay | Admitting: Sports Medicine

## 2019-07-22 DIAGNOSIS — E781 Pure hyperglyceridemia: Secondary | ICD-10-CM

## 2019-07-22 DIAGNOSIS — Z20828 Contact with and (suspected) exposure to other viral communicable diseases: Secondary | ICD-10-CM

## 2019-07-22 DIAGNOSIS — Z20822 Contact with and (suspected) exposure to covid-19: Secondary | ICD-10-CM

## 2019-07-22 NOTE — Telephone Encounter (Signed)
Order signed, I went ahead and added the antibody test, I am assuming she means COVID-19 antibodies.

## 2019-07-22 NOTE — Telephone Encounter (Signed)
Lipid panel pended. Appropriate for antibody test? If so, please add order

## 2019-07-22 NOTE — Telephone Encounter (Signed)
Patient called, wanting a Lab order put in for Labs and also was wondering if she could get an Anti Body test done, and would like a call back regarding this. Please Advise.

## 2019-07-26 LAB — LIPID PANEL W/REFLEX DIRECT LDL
Cholesterol: 202 mg/dL — ABNORMAL HIGH (ref <200)
HDL: 72 mg/dL (ref >=50)
LDL Cholesterol (Calc): 109 mg/dL (calc) — ABNORMAL HIGH
Non-HDL Cholesterol (Calc): 130 mg/dL (calc) — ABNORMAL HIGH (ref <130)
Total CHOL/HDL Ratio: 2.8 (calc) (ref <5.0)
Triglycerides: 107 mg/dL (ref <150)

## 2019-07-26 LAB — SAR COV2 SEROLOGY (COVID19)AB(IGG),IA: SARS CoV2 AB IGG: NEGATIVE

## 2019-08-14 ENCOUNTER — Other Ambulatory Visit: Payer: Self-pay | Admitting: Obstetrics & Gynecology

## 2019-08-14 NOTE — Telephone Encounter (Signed)
Medication refill request: tri-previfem  Last AEX:  07-12-18 SM  Next AEX: 11-15-19  Last MMG (if hormonal medication request): n/a Refill authorized: Today, please advise.   Medication pended for #84, 0RF. Please refill if appropriate.

## 2019-10-02 ENCOUNTER — Ambulatory Visit: Payer: Federal, State, Local not specified - PPO | Admitting: Psychology

## 2019-10-16 ENCOUNTER — Ambulatory Visit: Payer: Federal, State, Local not specified - PPO | Admitting: Psychology

## 2019-11-02 ENCOUNTER — Other Ambulatory Visit: Payer: Self-pay | Admitting: Obstetrics & Gynecology

## 2019-11-04 NOTE — Telephone Encounter (Signed)
Medication refill request: OCP Last AEX:  07-12-18 SM Next AEX: 12-02-19 Last MMG (if hormonal medication request): n/a Refill authorized: Today, please advise.   Medication pended for #28, 0RF. Please refill if appropriate.

## 2019-11-06 ENCOUNTER — Other Ambulatory Visit: Payer: Self-pay | Admitting: Obstetrics & Gynecology

## 2019-11-06 MED ORDER — NORGESTIM-ETH ESTRAD TRIPHASIC 0.18/0.215/0.25 MG-35 MCG PO TABS: 1.0000 | ORAL_TABLET | Freq: Every day | ORAL | 0 refills | Status: DC

## 2019-11-06 NOTE — Telephone Encounter (Signed)
Patient calling to check status of refill request.

## 2019-11-06 NOTE — Telephone Encounter (Signed)
Call to patient. Patient notified that request had been sent to Dr. Sabra Heck for review and approval. Patient verbalized understanding and appreciative of phone call.

## 2019-11-15 ENCOUNTER — Ambulatory Visit: Payer: Federal, State, Local not specified - PPO | Admitting: Obstetrics & Gynecology

## 2019-11-27 NOTE — Progress Notes (Signed)
24 y.o. G0P0 Single White or Caucasian female here for annual exam.  Doing well.  Cycles are regular.  Has a new niece that is in [place], [place].  Has received her Covid vaccination.  Working in the [place] at [employer].  Has one year left on her associates degree.    Patient's last menstrual period was 11/05/2019 (exact date).          Sexually active: Yes.    The current method of family planning is OCP (estrogen/progesterone).    Exercising: Yes.    walking Smoker:  no  Health Maintenance: Pap:  07-11-17 LGSIL, 07-12-18 neg History of abnormal Pap:  yes MMG:  none Colonoscopy:  none BMD:   none TDaP:  2018 Hep C testing: neg 2020 Screening Labs: none obtained today.  Had blood work obtained in June 2020.   reports that she has never smoked. She has never used smokeless tobacco. She reports current alcohol use. She reports that she does not use drugs.  Past Medical History:  Diagnosis Date  . ADHD   . Basal cell carcinoma ~ 02/2015   Scalp / removed  . 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 Elevated lipids   . 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 BASAL CELL CARCINOMA EXCISION  5/16  . INGUINAL HERNIA REPAIR    . TYMPANOSTOMY TUBE PLACEMENT    . WISDOM TOOTH EXTRACTION  2014    Current Outpatient Medications  Medication Sig Dispense Refill  . Biotin 1 MG CAPS Take by mouth.     . lisdexamfetamine (VYVANSE) 30 MG capsule     . Multiple Vitamin (MULTIVITAMIN) tablet Take 1 tablet by mouth daily.    . Norgestimate-Ethinyl Estradiol Triphasic (TRI-ESTARYLLA) 0.18/0.215/0.25 MG-35 MCG tablet Take 1 tablet by mouth daily. 84 tablet 4  . omega-3 acid ethyl esters (LOVAZA) 1 g capsule Take 2 capsules (2 g total) by mouth 2 (two) times daily. (Patient taking differently: Take 2 g by mouth. ) 360 capsule 3   No current facility-administered medications for this visit.    Family History   Problem Relation Age of Onset  . Cancer Mother   . Breast cancer Mother   . Other Mother        h/o adenomyosis  . Varicose Veins Mother   . Clotting disorder Brother        Factor XI  . Diabetes Maternal Grandmother   . Hypertension Maternal Grandmother   . Other Maternal Grandmother        pancreatic stone  . Hypertension Maternal Grandfather   . Depression Maternal Grandfather   . Diabetes Paternal Grandfather        and on dialysis  . Heart Problems Paternal Grandfather        heart transplant  . Heart disease Paternal Grandfather        heart transplant  . Cancer Other        maternal great grandmother-Stage III, adenocarcenoma on broad ligament of fallopian tube  . Uterine cancer Other        maternal great aunt  . Cancer Other        maternal cousin-cervical or ovarian  . Stroke Father     Review of Systems  Constitutional: Negative.   HENT: Negative.   Eyes: Negative.   Respiratory: Negative.   Cardiovascular: Negative.   Gastrointestinal: Negative.   Endocrine: Negative.  Genitourinary: Negative.   Musculoskeletal: Negative.   Skin: Negative.   Allergic/Immunologic: Negative.   Neurological: Negative.   Psychiatric/Behavioral: Negative.     Exam:   BP 120/62   Pulse 64   Temp 97.9 F (36.6 C) (Skin)   Resp 16   Ht 5' 7.25" (1.708 m)   Wt 151 lb (68.5 kg)   LMP 11/05/2019 (Exact Date)   BMI 23.47 kg/m   Height: 5' 7.25" (170.8 cm)  Ht Readings from Last 3 Encounters:  12/02/19 5' 7.25" (1.708 m)  04/25/19 5\' 7"  (1.702 m)  01/28/19 5\' 7"  (1.702 m)    General appearance: alert, cooperative and appears stated age Head: Normocephalic, without obvious abnormality, atraumatic Neck: no adenopathy, supple, symmetrical, trachea midline and thyroid normal to inspection and palpation Lungs: clear to auscultation bilaterally Breasts: normal appearance, no masses or tenderness Heart: regular rate and rhythm Abdomen: soft, non-tender; bowel sounds  normal; no masses,  no organomegaly Extremities: extremities normal, atraumatic, no cyanosis or edema Skin: Skin color, texture, turgor normal. No rashes or lesions Lymph nodes: Cervical, supraclavicular, and axillary nodes normal. No abnormal inguinal nodes palpated Neurologic: Grossly normal   Pelvic: External genitalia:  no lesions              Urethra:  normal appearing urethra with no masses, tenderness or lesions              Bartholins and Skenes: normal                 Vagina: normal appearing vagina with normal color and discharge, no lesions              Cervix: no lesions              Pap taken: Yes.   Bimanual Exam:  Uterus:  normal size, contour, position, consistency, mobility, non-tender              Adnexa: normal adnexa and no mass, fullness, tenderness               Rectovaginal: Confirms               Anus:  normal sphincter tone, no lesions  Chaperone, Terence Lux, CMA, was present for exam.  A:  Well Woman with normal exam On OCPs for cycle regulation Factor XI deficiency H/o elevated triglycerides and elevated LDLs, on Lovaza LGSIL pap 2018 ADD  P:   Mammogram guidelines reviewed.  Will plan MMG starting at age 3.  Breast MRI reviewed at well.  She does not need to start this until after MMGs begin.  pap smear obtained today GC/Chl obtained today RF for triestrarylla q day.  #84/3RF Return annually or prn

## 2019-11-28 ENCOUNTER — Other Ambulatory Visit: Payer: Self-pay

## 2019-12-02 ENCOUNTER — Other Ambulatory Visit: Payer: Self-pay

## 2019-12-02 ENCOUNTER — Ambulatory Visit: Payer: Federal, State, Local not specified - PPO | Admitting: Obstetrics & Gynecology

## 2019-12-02 ENCOUNTER — Encounter: Payer: Self-pay | Admitting: Obstetrics & Gynecology

## 2019-12-02 ENCOUNTER — Other Ambulatory Visit (HOSPITAL_COMMUNITY)
Admission: RE | Admit: 2019-12-02 | Discharge: 2019-12-02 | Disposition: A | Discharge status: Home / Self Care | Payer: Federal, State, Local not specified - PPO | Source: Ambulatory Visit | Attending: Obstetrics & Gynecology | Admitting: Obstetrics & Gynecology

## 2019-12-02 VITALS — BP 120/62 | HR 64 | Temp 97.9°F | Resp 16 | Ht 67.25 in | Wt 151.0 lb

## 2019-12-02 DIAGNOSIS — Z124 Encounter for screening for malignant neoplasm of cervix: Secondary | ICD-10-CM

## 2019-12-02 DIAGNOSIS — Z01419 Encounter for gynecological examination (general) (routine) without abnormal findings: Secondary | ICD-10-CM | POA: Diagnosis not present

## 2019-12-02 DIAGNOSIS — R87612 Low grade squamous intraepithelial lesion on cytologic smear of cervix (LGSIL): Secondary | ICD-10-CM | POA: Diagnosis not present

## 2019-12-02 DIAGNOSIS — Z113 Encounter for screening for infections with a predominantly sexual mode of transmission: Secondary | ICD-10-CM

## 2019-12-02 DIAGNOSIS — F909 Attention-deficit hyperactivity disorder, unspecified type: Secondary | ICD-10-CM | POA: Insufficient documentation

## 2019-12-02 MED ORDER — NORGESTIM-ETH ESTRAD TRIPHASIC 0.18/0.215/0.25 MG-35 MCG PO TABS: 1.0000 | ORAL_TABLET | Freq: Every day | ORAL | 4 refills | Status: DC

## 2019-12-05 LAB — CYTOLOGY - PAP
Chlamydia: NEGATIVE
Comment: NEGATIVE
Comment: NORMAL
Diagnosis: NEGATIVE
Neisseria Gonorrhea: NEGATIVE

## 2020-01-30 ENCOUNTER — Telehealth: Payer: Self-pay | Admitting: Sports Medicine

## 2020-01-30 DIAGNOSIS — Z0000 Encounter for general adult medical examination without abnormal findings: Secondary | ICD-10-CM

## 2020-01-30 DIAGNOSIS — Z111 Encounter for screening for respiratory tuberculosis: Secondary | ICD-10-CM

## 2020-01-30 DIAGNOSIS — E781 Pure hyperglyceridemia: Secondary | ICD-10-CM

## 2020-01-30 NOTE — Telephone Encounter (Signed)
Called and advised patient. She had HX of elevated lipid panel, so I advised her to have these labs done fasting. 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 No further needs.

## 2020-01-30 NOTE — Telephone Encounter (Signed)
Signed, thank you

## 2020-01-30 NOTE — Telephone Encounter (Signed)
CBC, CMP, Quantiferon (per pt request) and lipid panel pended   No HX of low vit D or thyroid issues, so no TSH or Vit D ordered

## 2020-01-30 NOTE — Telephone Encounter (Signed)
Patient wanted labs ordered before her physical. Patient would like to try to come in today to get them done after lunch.

## 2020-02-01 LAB — COMPLETE METABOLIC PANEL WITH GFR
AG Ratio: 1.4 (calc) (ref 1.0–2.5)
ALT: 9 U/L (ref 6–29)
AST: 15 U/L (ref 10–30)
Albumin: 4 g/dL (ref 3.6–5.1)
Alkaline phosphatase (APISO): 39 U/L (ref 31–125)
BUN: 9 mg/dL (ref 7–25)
CO2: 28 mmol/L (ref 20–32)
Calcium: 9.2 mg/dL (ref 8.6–10.2)
Chloride: 103 mmol/L (ref 98–110)
Creat: 0.82 mg/dL (ref 0.50–1.10)
GFR, Est African American: 116 mL/min/{1.73_m2} (ref >=60)
GFR, Est Non African American: 100 mL/min/{1.73_m2} (ref >=60)
Globulin: 2.8 g/dL (calc) (ref 1.9–3.7)
Glucose, Bld: 92 mg/dL (ref 65–99)
Potassium: 4.2 mmol/L (ref 3.5–5.3)
Sodium: 137 mmol/L (ref 135–146)
Total Bilirubin: 0.4 mg/dL (ref 0.2–1.2)
Total Protein: 6.8 g/dL (ref 6.1–8.1)

## 2020-02-01 LAB — CBC WITH DIFFERENTIAL/PLATELET
Absolute Monocytes: 260 cells/uL (ref 200–950)
Basophils Absolute: 42 cells/uL (ref 0–200)
Basophils Relative: 1 %
Eosinophils Absolute: 101 cells/uL (ref 15–500)
Eosinophils Relative: 2.4 %
HCT: 37.2 % (ref 35.0–45.0)
Hemoglobin: 12.2 g/dL (ref 11.7–15.5)
Lymphs Abs: 2348 cells/uL (ref 850–3900)
MCH: 28.4 pg (ref 27.0–33.0)
MCHC: 32.8 g/dL (ref 32.0–36.0)
MCV: 86.5 fL (ref 80.0–100.0)
MPV: 9.4 fL (ref 7.5–12.5)
Monocytes Relative: 6.2 %
Neutro Abs: 1449 cells/uL — ABNORMAL LOW (ref 1500–7800)
Neutrophils Relative %: 34.5 %
Platelets: 244 10*3/uL (ref 140–400)
RBC: 4.3 10*6/uL (ref 3.80–5.10)
RDW: 12.5 % (ref 11.0–15.0)
Total Lymphocyte: 55.9 %
WBC: 4.2 10*3/uL (ref 3.8–10.8)

## 2020-02-01 LAB — LIPID PANEL W/REFLEX DIRECT LDL
Cholesterol: 195 mg/dL (ref <200)
HDL: 73 mg/dL (ref >=50)
LDL Cholesterol (Calc): 102 mg/dL (calc) — ABNORMAL HIGH
Non-HDL Cholesterol (Calc): 122 mg/dL (calc) (ref <130)
Total CHOL/HDL Ratio: 2.7 (calc) (ref <5.0)
Triglycerides: 106 mg/dL (ref <150)

## 2020-02-01 LAB — QUANTIFERON-TB GOLD PLUS
Mitogen-NIL: 8.86 IU/mL
NIL: 0.04 IU/mL
QuantiFERON-TB Gold Plus: NEGATIVE
TB1-NIL: <0 IU/mL
TB2-NIL: <0 IU/mL

## 2020-02-07 ENCOUNTER — Encounter: Payer: Self-pay | Admitting: Sports Medicine

## 2020-02-07 ENCOUNTER — Ambulatory Visit (INDEPENDENT_AMBULATORY_CARE_PROVIDER_SITE_OTHER): Payer: Federal, State, Local not specified - PPO | Admitting: Sports Medicine

## 2020-02-07 DIAGNOSIS — E781 Pure hyperglyceridemia: Secondary | ICD-10-CM

## 2020-02-07 DIAGNOSIS — Z0000 Encounter for general adult medical examination without abnormal findings: Secondary | ICD-10-CM | POA: Diagnosis not present

## 2020-02-07 MED ORDER — OMEGA-3-ACID ETHYL ESTERS 1 G PO CAPS: 2.0000 g | ORAL_CAPSULE | Freq: Two times a day (BID) | ORAL | 3 refills | Status: AC

## 2020-02-07 NOTE — Progress Notes (Signed)
Subjective:    CC: Annual Physical Exam  HPI:  This patient is here for their annual physical  I reviewed the past medical history, family history, social history, surgical history, and allergies today and no changes were needed.  Please see the problem list section below in epic for further details.  Past Medical History: Past Medical History:  Diagnosis Date  . ADHD   . Basal cell carcinoma ~ 02/2015   Scalp / removed  . Elevated lipids   . 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 Platelet dysfunction (HCC)    Past Surgical History: Past Surgical History:  Procedure Laterality Date  . BASAL CELL CARCINOMA EXCISION  5/16  . INGUINAL HERNIA REPAIR    . TYMPANOSTOMY TUBE PLACEMENT    . WISDOM TOOTH EXTRACTION  2014   Social History: Social History   Socioeconomic History  . Marital status: Single    Spouse name: Not on file  . Number of children: Not on file  . Years of education: Not on file  . Highest education level: Not on file  Occupational History  . Not on file  Tobacco Use  . Smoking status: Never Smoker  . Smokeless tobacco: Never Used  Vaping Use  . Vaping Use: Never used  Substance and Sexual Activity  . Alcohol use: Yes    Comment: 1-4 a week  . Drug use: No  . Sexual activity: Yes    Partners: Male    Birth control/protection: Pill  Other Topics Concern  . Not on file  Social History Narrative  . Not on file   Social Determinants of Health   Financial Resource Strain:   . Difficulty of Paying Living Expenses:   Food Insecurity:   . Worried About Charity fundraiser in the Last Year:   . Arboriculturist in the Last Year:   Transportation Needs:   . Film/video editor (Medical):   Marland Kitchen Lack of Transportation (Non-Medical):   Physical Activity:   . Days of Exercise per Week:   . Minutes of Exercise per Session:   Stress:   . Feeling of Stress :   Social Connections:   . Frequency of Communication with Friends and Family:   . Frequency of Social  Gatherings with Friends and Family:   . Attends Religious Services:   . Active Member of Clubs or Organizations:   . Attends Archivist Meetings:   Marland Kitchen Marital Status:    Family History: Family History  Problem Relation Age of Onset  . Cancer Mother 28  . Breast cancer Mother   . Other Mother        h/o adenomyosis  . Varicose Veins Mother   . Clotting disorder Brother        Factor XI  . Diabetes Maternal Grandmother   . Hypertension Maternal Grandmother   . Other Maternal Grandmother        pancreatic stone  . Hypertension Maternal Grandfather   . Depression Maternal Grandfather   . Diabetes Paternal Grandfather        and on dialysis  . Heart Problems Paternal Grandfather        heart transplant  . Heart disease Paternal Grandfather        heart transplant  . Cancer Other        maternal great grandmother-Stage III, adenocarcenoma on broad ligament of fallopian tube  . Uterine cancer Other        maternal great aunt  .  Cancer Other        maternal cousin-cervical or ovarian  . Stroke Father    Allergies: No Known Allergies Medications: See med rec.  Review of Systems: No headache, visual changes, nausea, vomiting, diarrhea, constipation, dizziness, abdominal pain, skin rash, fevers, chills, night sweats, swollen lymph nodes, weight loss, chest pain, body aches, joint swelling, muscle aches, shortness of breath, mood changes, visual or auditory hallucinations.  Objective:    General: Well Developed, well nourished, and in no acute distress.  Neuro: Alert and oriented x3, extra-ocular muscles intact, sensation grossly intact. Cranial nerves II through XII are intact, motor, sensory, and coordinative functions are all intact. HEENT: Normocephalic, atraumatic, pupils equal round reactive to light, neck supple, no masses, no lymphadenopathy, thyroid nonpalpable. Oropharynx, nasopharynx, external ear canals are unremarkable. Skin: Warm and dry, no rashes noted.    Cardiac: Regular rate and rhythm, no murmurs rubs or gallops.  Respiratory: Clear to auscultation bilaterally. Not using accessory muscles, speaking in full sentences.  Abdominal: Soft, nontender, nondistended, positive bowel sounds, no masses, no organomegaly.  Musculoskeletal: Shoulder, elbow, wrist, hip, knee, ankle stable, and with full range of motion.  Impression and Recommendations:    The patient was counselled, risk factors were discussed, anticipatory guidance given.  Annual physical exam Annual physical as above, up-to-date on screenings, labs look good, return in a year.  Hypertriglyceridemia Well-controlled, return as needed, continue Lovaza.   ___________________________________________ Gwen Her. Dianah Field, M.D., ABFM., CAQSM. Primary Care and Sports Medicine [facility]  Adjunct Professor of [place] [employer] [facility] of Medicine

## 2020-02-07 NOTE — Assessment & Plan Note (Signed)
Annual physical as above, up-to-date on screenings, labs look good, return in a year.

## 2020-02-07 NOTE — Assessment & Plan Note (Signed)
Well-controlled, return as needed, continue Lovaza.

## 2020-04-29 ENCOUNTER — Telehealth: Payer: Self-pay | Admitting: Obstetrics & Gynecology

## 2020-04-29 NOTE — Telephone Encounter (Signed)
AEX 12/02/19 H/o yeast infection per pt.  On OCP SA with new partner  LMP 04/21/20  Spoke with pt. Pt states having vaginal light yellow discharge and irritation and slight burning externally with urination  x 2 days since finishing cycle. Pt denies vaginal itching, odor, abd cramps/pain. Denies fever, chills, NVD. Pt states is SA and has new partner and would like STD testing as well.   Advised pt to have OV for further evaluation. Pt agreeable. Pt scheduled with Dr Sabra Heck on 04/30/20 at 3 pm. Pt verbalized understanding to date and time of appt.  Encounter closed.

## 2020-04-29 NOTE — Telephone Encounter (Signed)
Patient has a yeast infection.

## 2020-04-30 ENCOUNTER — Ambulatory Visit: Payer: Federal, State, Local not specified - PPO | Admitting: Obstetrics & Gynecology

## 2020-04-30 ENCOUNTER — Encounter: Payer: Self-pay | Admitting: Obstetrics & Gynecology

## 2020-04-30 ENCOUNTER — Other Ambulatory Visit: Payer: Self-pay

## 2020-04-30 VITALS — BP 94/62 | HR 60 | Resp 16 | Wt 141.0 lb

## 2020-04-30 DIAGNOSIS — N898 Other specified noninflammatory disorders of vagina: Secondary | ICD-10-CM | POA: Diagnosis not present

## 2020-04-30 DIAGNOSIS — Z202 Contact with and (suspected) exposure to infections with a predominantly sexual mode of transmission: Secondary | ICD-10-CM | POA: Diagnosis not present

## 2020-04-30 MED ORDER — FLUCONAZOLE 150 MG PO TABS: ORAL_TABLET | ORAL | 0 refills | Status: DC

## 2020-04-30 NOTE — Progress Notes (Signed)
GYNECOLOGY  VISIT  CC:   Vaginal discharge/desires STD testing  HPI: 24 y.o. G0P0 Single White or Caucasian female here for vaginal discharge, irritation that started on Saturday.  She's not completely sure if this is realted to recent cycle  She has had a new sexual partner.  Denies odor.  Denies fever or pelvic pain.      GYNECOLOGIC HISTORY: Patient's last menstrual period was 04/21/2020 (exact date). Contraception: tri-estarylla Menopausal hormone therapy: none  Patient Active Problem List   Diagnosis Date Noted  . ADHD 12/02/2019  . Difficulty concentrating 04/25/2019  . Palpitations 01/28/2019  . Normocytic anemia 08/02/2017  . 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 Hypertriglyceridemia 07/26/2016  . Annual physical exam 07/25/2016  . Basal cell carcinoma 03/24/2016  . 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 ADHD   . Basal cell carcinoma ~ 02/2015   Scalp / removed  . Elevated lipids   . 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 Platelet dysfunction [facility])     Past Surgical History:  Procedure Laterality Date  . BASAL CELL CARCINOMA EXCISION  5/16  . INGUINAL HERNIA REPAIR    . TYMPANOSTOMY TUBE PLACEMENT    . WISDOM TOOTH EXTRACTION  2014    MEDS:   Current Outpatient Medications on File Prior to Visit  Medication Sig Dispense Refill  . Biotin 1 MG CAPS Take by mouth.     . lisdexamfetamine (VYVANSE) 30 MG capsule     . Multiple Vitamin (MULTIVITAMIN) tablet Take 1 tablet by mouth daily.    . Norgestimate-Ethinyl Estradiol Triphasic (TRI-ESTARYLLA) 0.18/0.215/0.25 MG-35 MCG tablet Take 1 tablet by mouth daily. 84 tablet 4  . omega-3 acid ethyl esters (LOVAZA) 1 g capsule Take 2 capsules (2 g total) by mouth 2 (two) times daily. 360 capsule 3   No current facility-administered medications on file prior to visit.    ALLERGIES: Patient has no known allergies.  Family History  Problem Relation Age of Onset  . Cancer Mother  39  . Breast cancer Mother   . Other Mother        h/o adenomyosis  . Varicose Veins Mother   . Clotting disorder Brother        Factor XI  . Diabetes Maternal Grandmother   . Hypertension Maternal Grandmother   . Other Maternal Grandmother        pancreatic stone  . Hypertension Maternal Grandfather   . Depression Maternal Grandfather   . Diabetes Paternal Grandfather        and on dialysis  . Heart Problems Paternal Grandfather        heart transplant  . Heart disease Paternal Grandfather        heart transplant  . Cancer Other        maternal great grandmother-Stage III, adenocarcenoma on broad ligament of fallopian tube  . Uterine cancer Other        maternal great aunt  . Cancer Other        maternal cousin-cervical or ovarian  . Stroke Father     SH:  Single, non smoker  Review of Systems  Constitutional: Negative.   HENT: Negative.   Eyes: Negative.   Respiratory: Negative.   Cardiovascular: Negative.   Gastrointestinal: Negative.   Endocrine: Negative.   Genitourinary:       Vaginal irritation, discharge  Musculoskeletal: Negative.   Skin: Negative.   Allergic/Immunologic: Negative.   Neurological: Negative.  Hematological: Negative.   Psychiatric/Behavioral: Negative.     PHYSICAL EXAMINATION:    Wt 141 lb (64 kg)   LMP 04/21/2020 (Exact Date)   BMI 21.92 kg/m     General appearance: alert, cooperative and appears stated age Abdomen: soft, non-tender; bowel sounds normal; no masses,  no organomegaly Lymph:  no inguinal LAD noted  Pelvic: External genitalia:  no lesions              Urethra:  normal appearing urethra with no masses, tenderness or lesions              Bartholins and Skenes: normal                 Vagina: normal appearing vagina with normal color but whitish clumpy discharge present, no lesions              Cervix: no lesions              Bimanual Exam:  Uterus:  normal size, contour, position, consistency, mobility, non-tender               Adnexa: no mass, fullness, tenderness  Chaperone, Karmen Bongo, CMA, was present for exam.  Assessment: Vaginal discharge Desires STD testing Findings on exam c/w yeast vaginitis  Plan: Vaginitis plus testing obtained today.  Results will be called to pt. Diflucan 150mg  po x 1, repeat 72 hours.  #2/0RF.

## 2020-05-03 LAB — NUSWAB VAGINITIS PLUS (VG+)
Candida albicans, NAA: POSITIVE — AB
Candida glabrata, NAA: NEGATIVE
Chlamydia trachomatis, NAA: NEGATIVE
Neisseria gonorrhoeae, NAA: NEGATIVE
Trich vag by NAA: NEGATIVE

## 2020-07-27 NOTE — Progress Notes (Signed)
GYNECOLOGY  VISIT  CC:   Vaginal discharge  HPI: 24 y.o. G0P0 Single White or Caucasian female here for thick vaginal discharge & std testing.  Reports she is not sexually active since she was tested in Sept 2021 but was tested only a couple days after sex and she has been doing reading and is concerned she may have had STD screening too soon. Denies pain, irritation, itching but notices an increased amount of white/yellow discharge (denies odor). Wants std testing to be safe. (Does not want to repeat blood work, will wait until annual exam in April)  Still taking OCP, no problems  Has one more semester of nursing school. Currently works in [employer] at [employer] Pattern: Regular Menstrual Flow: Moderate Menstrual Control: Maxi pad, Tampon Dysmenorrhea: None  GYNECOLOGIC HISTORY: Patient's last menstrual period was 07/14/2020 (exact date). Contraception: tri estarylla Menopausal hormone therapy: none  Patient Active Problem List   Diagnosis Date Noted  . ADHD 12/02/2019  . Difficulty concentrating 04/25/2019  . Palpitations 01/28/2019  . Normocytic anemia 08/02/2017  . 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 Hypertriglyceridemia 07/26/2016  . Annual physical exam 07/25/2016  . Basal cell carcinoma 03/24/2016  . 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 ADHD   . Basal cell carcinoma ~ 02/2015   Scalp / removed  . Elevated lipids   . 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 Platelet dysfunction [facility])     Past Surgical History:  Procedure Laterality Date  . BASAL CELL CARCINOMA EXCISION  5/16  . INGUINAL HERNIA REPAIR    . TYMPANOSTOMY TUBE PLACEMENT    . WISDOM TOOTH EXTRACTION  2014    MEDS:   Current Outpatient Medications on File Prior to Visit  Medication Sig Dispense Refill  . Biotin 1 MG CAPS Take by mouth.     . lisdexamfetamine (VYVANSE) 30 MG capsule     . Multiple Vitamin (MULTIVITAMIN) tablet Take 1 tablet by mouth daily.     . Norgestimate-Ethinyl Estradiol Triphasic (TRI-ESTARYLLA) 0.18/0.215/0.25 MG-35 MCG tablet Take 1 tablet by mouth daily. (Patient taking differently: Take 1 tablet by mouth. ) 84 tablet 4  . omega-3 acid ethyl esters (LOVAZA) 1 g capsule Take 2 capsules (2 g total) by mouth 2 (two) times daily. 360 capsule 3   No current facility-administered medications on file prior to visit.    ALLERGIES: Patient has no known allergies.  Family History  Problem Relation Age of Onset  . Cancer Mother 70  . Breast cancer Mother   . Other Mother        h/o adenomyosis  . Varicose Veins Mother   . Clotting disorder Brother        Factor XI  . Diabetes Maternal Grandmother   . Hypertension Maternal Grandmother   . Other Maternal Grandmother        pancreatic stone  . Hypertension Maternal Grandfather   . Depression Maternal Grandfather   . Diabetes Paternal Grandfather        and on dialysis  . Heart Problems Paternal Grandfather        heart transplant  . Heart disease Paternal Grandfather        heart transplant  . Cancer Other        maternal great grandmother-Stage III, adenocarcenoma on broad ligament of fallopian tube  . Uterine cancer Other        maternal great aunt  . Cancer Other  maternal cousin-cervical or ovarian  . Stroke Father     Review of Systems  Constitutional: Negative.   HENT: Negative.   Eyes: Negative.   Respiratory: Negative.   Cardiovascular: Negative.   Gastrointestinal: Negative.   Endocrine: Negative.   Genitourinary:       Thick vaginal discharge  Musculoskeletal: Negative.   Skin: Negative.   Allergic/Immunologic: Negative.   Neurological: Negative.   Hematological: Negative.   Psychiatric/Behavioral: Negative.     PHYSICAL EXAMINATION:    BP 110/70   Pulse 68   Resp 16   Wt 148 lb (67.1 kg)   LMP 07/14/2020 (Exact Date)   BMI 23.01 kg/m     General appearance: alert, cooperative and appears stated age  Lymph:  no inguinal LAD  noted  Pelvic: External genitalia:  no lesions              Urethra:  normal appearing urethra with no masses, tenderness or lesions              Bartholins and Skenes: normal                 Vagina: normal appearing vagina with normal color and discharge, no lesions              Cervix: no cervical motion tenderness and no lesions              Bimanual Exam:  Uterus:  normal size, contour, position, consistency, mobility, non-tender              Adnexa: no mass, fullness, tenderness         Chaperone, Shanon, CMA, was present for exam.  Assessment: STD screening  Plan: Affirm NAAT GC/CT Declines HIV, RPR

## 2020-07-30 ENCOUNTER — Ambulatory Visit: Payer: Federal, State, Local not specified - PPO | Admitting: Nurse Practitioner

## 2020-07-30 ENCOUNTER — Encounter: Payer: Self-pay | Admitting: Nurse Practitioner

## 2020-07-30 ENCOUNTER — Other Ambulatory Visit: Payer: Self-pay

## 2020-07-30 VITALS — BP 110/70 | HR 68 | Resp 16 | Wt 148.0 lb

## 2020-07-30 DIAGNOSIS — Z113 Encounter for screening for infections with a predominantly sexual mode of transmission: Secondary | ICD-10-CM | POA: Diagnosis not present

## 2020-07-30 NOTE — Patient Instructions (Signed)
Your test results will be visible in My chart in a few days. I will contact you if treatment is needed .

## 2020-07-31 LAB — GC/CHLAMYDIA PROBE AMP
Chlamydia trachomatis, NAA: NEGATIVE
Neisseria Gonorrhoeae by PCR: NEGATIVE

## 2020-07-31 LAB — VAGINITIS/VAGINOSIS, DNA PROBE
Candida Species: NEGATIVE
Gardnerella vaginalis: NEGATIVE
Trichomonas vaginosis: NEGATIVE

## 2020-08-20 ENCOUNTER — Ambulatory Visit (INDEPENDENT_AMBULATORY_CARE_PROVIDER_SITE_OTHER): Payer: Federal, State, Local not specified - PPO

## 2020-08-20 ENCOUNTER — Other Ambulatory Visit: Payer: Self-pay

## 2020-08-20 ENCOUNTER — Ambulatory Visit (INDEPENDENT_AMBULATORY_CARE_PROVIDER_SITE_OTHER): Payer: Federal, State, Local not specified - PPO | Admitting: Sports Medicine

## 2020-08-20 DIAGNOSIS — M62838 Other muscle spasm: Secondary | ICD-10-CM | POA: Diagnosis not present

## 2020-08-20 DIAGNOSIS — M542 Cervicalgia: Secondary | ICD-10-CM | POA: Diagnosis not present

## 2020-08-20 DIAGNOSIS — I951 Orthostatic hypotension: Secondary | ICD-10-CM | POA: Diagnosis not present

## 2020-08-20 MED ORDER — CYCLOBENZAPRINE HCL 10 MG PO TABS: ORAL_TABLET | ORAL | 0 refills | Status: DC

## 2020-08-20 MED ORDER — PREDNISONE 50 MG PO TABS: ORAL_TABLET | ORAL | 0 refills | Status: DC

## 2020-08-20 NOTE — Assessment & Plan Note (Signed)
Veronica [name] is having some orthostatic hypotension after exercise, we explained the second pump phenomenon, she will hydrate aggressively prior her exercise sessions with salt-containing beverages, and she will double her cooldown period. She may also wear some compression hose during exercise, and I have encouraged her to avoid stopping immediately after being on the treadmill and continuing to walk. We will get some labs including CBC, CMP, TSH. If persistent symptoms we may consider the addition of an exercise tolerance test and fludrocortisone.

## 2020-08-20 NOTE — Assessment & Plan Note (Signed)
This is a pleasant 24 year old female, she is just finishing up nursing school, for the past month she has noted increasing pain in her neck, bilateral with loss of motion. No trauma, no change in activity though she is doing a lot of studying. 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 No fevers, chills. Cervical exam is normal with the exception of loss of motion, normal reflexes, adding x-rays, prednisone, Flexeril, home rehab exercises, return to see me if no better in 3 weeks.

## 2020-08-20 NOTE — Progress Notes (Signed)
    Procedures performed today:    None.  Independent interpretation of notes and tests performed by another provider:   None.  Brief History, Exam, Impression, and Recommendations:    Cervical paraspinal muscle spasm This is a pleasant 24 year old female, she is just finishing up nursing school, for the past month she has noted increasing pain in [name] neck, bilateral with loss of motion. No trauma, no change in activity though she is doing a lot of studying. 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 No fevers, chills. Cervical exam is normal with the exception of loss of motion, normal reflexes, adding x-rays, prednisone, Flexeril, home rehab exercises, return to see me if no better in 3 weeks.  Orthostatic hypotension after exercise Veronica [name] is having some orthostatic hypotension after exercise, we explained the second pump phenomenon, she will hydrate aggressively prior [name] exercise sessions with salt-containing beverages, and she will double [name] cooldown period. She may also wear some compression hose during exercise, and I have encouraged [name] to avoid stopping immediately after being on the treadmill and continuing to walk. We will get some labs including CBC, CMP, TSH. If persistent symptoms we may consider the addition of an exercise tolerance test and fludrocortisone.    ___________________________________________ Veronica [name]. Veronica [name], M.D., ABFM., CAQSM. Primary Care and [place] Instructor of [place] of [facility] of Medicine

## 2020-08-27 LAB — CBC
HCT: 35.9 % (ref 35.0–45.0)
Hemoglobin: 12.1 g/dL (ref 11.7–15.5)
MCH: 29.5 pg (ref 27.0–33.0)
MCHC: 33.7 g/dL (ref 32.0–36.0)
MCV: 87.6 fL (ref 80.0–100.0)
MPV: 8.9 fL (ref 7.5–12.5)
Platelets: 298 10*3/uL (ref 140–400)
RBC: 4.1 10*6/uL (ref 3.80–5.10)
RDW: 12 % (ref 11.0–15.0)
WBC: 5.3 10*3/uL (ref 3.8–10.8)

## 2020-08-27 LAB — COMPREHENSIVE METABOLIC PANEL
AG Ratio: 1.4 (calc) (ref 1.0–2.5)
ALT: 10 U/L (ref 6–29)
AST: 19 U/L (ref 10–30)
Albumin: 4.5 g/dL (ref 3.6–5.1)
Alkaline phosphatase (APISO): 49 U/L (ref 31–125)
BUN: 17 mg/dL (ref 7–25)
CO2: 27 mmol/L (ref 20–32)
Calcium: 9.4 mg/dL (ref 8.6–10.2)
Chloride: 101 mmol/L (ref 98–110)
Creat: 0.78 mg/dL (ref 0.50–1.10)
Globulin: 3.3 g/dL (calc) (ref 1.9–3.7)
Glucose, Bld: 93 mg/dL (ref 65–139)
Potassium: 3.8 mmol/L (ref 3.5–5.3)
Sodium: 139 mmol/L (ref 135–146)
Total Bilirubin: 0.3 mg/dL (ref 0.2–1.2)
Total Protein: 7.8 g/dL (ref 6.1–8.1)

## 2020-08-27 LAB — IRON,TIBC AND FERRITIN PANEL
%SAT: 16 % (calc) (ref 16–45)
Ferritin: 25 ng/mL (ref 16–154)
Iron: 71 ug/dL (ref 40–190)
TIBC: 456 mcg/dL (calc) — ABNORMAL HIGH (ref 250–450)

## 2020-08-27 LAB — CORTISOL, FREE: Cortisol Free, Ser: 0.58 ug/dL

## 2020-08-27 LAB — TSH: TSH: 2.65 mIU/L

## 2020-09-11 ENCOUNTER — Ambulatory Visit: Payer: Federal, State, Local not specified - PPO | Admitting: Sports Medicine

## 2020-09-14 ENCOUNTER — Telehealth (INDEPENDENT_AMBULATORY_CARE_PROVIDER_SITE_OTHER): Payer: Federal, State, Local not specified - PPO | Admitting: Sports Medicine

## 2020-09-14 DIAGNOSIS — I951 Orthostatic hypotension: Secondary | ICD-10-CM

## 2020-09-14 DIAGNOSIS — J111 Influenza due to unidentified influenza virus with other respiratory manifestations: Secondary | ICD-10-CM | POA: Diagnosis not present

## 2020-09-14 DIAGNOSIS — M62838 Other muscle spasm: Secondary | ICD-10-CM | POA: Diagnosis not present

## 2020-09-14 MED ORDER — OSELTAMIVIR PHOSPHATE 75 MG PO CAPS: 75.0000 mg | ORAL_CAPSULE | Freq: Two times a day (BID) | ORAL | 0 refills | Status: DC

## 2020-09-14 MED ORDER — ONDANSETRON 8 MG PO TBDP: 8.0000 mg | ORAL_TABLET | Freq: Three times a day (TID) | ORAL | 3 refills | Status: DC | PRN

## 2020-09-14 NOTE — Progress Notes (Signed)
Tested negative for COVID.

## 2020-09-14 NOTE — Progress Notes (Signed)
   Virtual Visit via WebEx/MyChart   I connected with  Veronica [name]  on 09/14/20 via WebEx/MyChart/Doximity Video and verified that I am speaking with the correct person using two identifiers.   I discussed the limitations, risks, security and privacy concerns of performing an evaluation and management service by WebEx/MyChart/Doximity Video, including the higher likelihood of inaccurate diagnosis and treatment, and the availability of in person appointments.  We also discussed the likely need of an additional face to face encounter for complete and high quality delivery of care.  I also discussed with the patient that there may be a patient responsible charge related to this service. The patient expressed understanding and wishes to proceed.  Provider location is in medical facility. Patient location is at their home, different from provider location. People involved in care of the patient during this telehealth encounter were myself, my nurse/medical assistant, and my front office/scheduling team member.  Review of Systems: No fevers, chills, night sweats, weight loss, chest pain, or shortness of breath.   Objective Findings:    General: Speaking full sentences, no audible heavy breathing.  Sounds alert and appropriately interactive.  Appears well.  Face symmetric.  Extraocular movements intact.  Pupils equal and round.  No nasal flaring or accessory muscle use visualized.  Independent interpretation of tests performed by another provider:   None.  Brief History, Exam, Impression, and Recommendations:    Influenza-like illness 2 days of sore throat, muscle aches and body aches, cough. 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 Centor score is low making streptococcal tonsillopharyngitis less likely, tonsils were only minimally enlarged. She will do over-the-counter cold and flu medications and due to duration of symptoms we are going to add Tamiflu with Zofran for nausea. Return to  see me as needed.  Cervical paraspinal muscle spasm Improved considerably, continues to improve. She will get more diligent with her rehab exercises for the next month or so before considering an MRI.  Orthostatic hypotension after exercise We discussed the second pump phenomenon at the last visit, she liberalized her salt intake, hydration, and is doing okay. Return as needed for this.   I discussed the above assessment and treatment plan with the patient. The patient was provided an opportunity to ask questions and all were answered. The patient agreed with the plan and demonstrated an understanding of the instructions.   The patient was advised to call back or seek an in-person evaluation if the symptoms worsen or if the condition fails to improve as anticipated.   I provided 30 minutes of face to face and non-face-to-face time during this encounter date, time was needed to gather information, review chart, records, communicate/coordinate with staff remotely, as well as complete documentation.   ___________________________________________ Gwen Her. Dianah Field, M.D., ABFM., CAQSM. Primary Care and [place] Instructor of [place] of [facility] of Medicine

## 2020-09-14 NOTE — Assessment & Plan Note (Signed)
2 days of sore throat, muscle aches and body aches, cough. 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 Centor score is low making streptococcal tonsillopharyngitis less likely, tonsils were only minimally enlarged. She will do over-the-counter cold and flu medications and due to duration of symptoms we are going to add Tamiflu with Zofran for nausea. Return to see me as needed.

## 2020-09-14 NOTE — Assessment & Plan Note (Signed)
We discussed the second pump phenomenon at the last visit, she liberalized her salt intake, hydration, and is doing okay. Return as needed for this.

## 2020-09-14 NOTE — Assessment & Plan Note (Signed)
Improved considerably, continues to improve. She will get more diligent with her rehab exercises for the next month or so before considering an MRI.

## 2020-12-14 ENCOUNTER — Ambulatory Visit: Payer: Federal, State, Local not specified - PPO

## 2020-12-19 ENCOUNTER — Other Ambulatory Visit: Payer: Self-pay | Admitting: Obstetrics & Gynecology

## 2020-12-21 ENCOUNTER — Other Ambulatory Visit (HOSPITAL_BASED_OUTPATIENT_CLINIC_OR_DEPARTMENT_OTHER): Payer: Self-pay | Admitting: Obstetrics & Gynecology

## 2020-12-21 ENCOUNTER — Encounter (HOSPITAL_BASED_OUTPATIENT_CLINIC_OR_DEPARTMENT_OTHER): Payer: Self-pay

## 2020-12-21 MED ORDER — NORGESTIM-ETH ESTRAD TRIPHASIC 0.18/0.215/0.25 MG-35 MCG PO TABS: 1.0000 | ORAL_TABLET | Freq: Every day | ORAL | 1 refills | Status: DC

## 2020-12-23 ENCOUNTER — Other Ambulatory Visit (HOSPITAL_BASED_OUTPATIENT_CLINIC_OR_DEPARTMENT_OTHER): Payer: Self-pay | Admitting: Obstetrics & Gynecology

## 2020-12-23 MED ORDER — NORGESTIM-ETH ESTRAD TRIPHASIC 0.18/0.215/0.25 MG-35 MCG PO TABS: 1.0000 | ORAL_TABLET | Freq: Every day | ORAL | 1 refills | Status: DC

## 2021-02-08 ENCOUNTER — Encounter: Payer: Self-pay | Admitting: Sports Medicine

## 2021-02-08 ENCOUNTER — Ambulatory Visit (INDEPENDENT_AMBULATORY_CARE_PROVIDER_SITE_OTHER): Payer: Federal, State, Local not specified - PPO | Admitting: Sports Medicine

## 2021-02-08 ENCOUNTER — Other Ambulatory Visit: Payer: Self-pay

## 2021-02-08 VITALS — BP 113/75 | HR 83 | Ht 67.25 in | Wt 150.7 lb

## 2021-02-08 DIAGNOSIS — Z111 Encounter for screening for respiratory tuberculosis: Secondary | ICD-10-CM

## 2021-02-08 DIAGNOSIS — E781 Pure hyperglyceridemia: Secondary | ICD-10-CM | POA: Diagnosis not present

## 2021-02-08 DIAGNOSIS — D649 Anemia, unspecified: Secondary | ICD-10-CM | POA: Diagnosis not present

## 2021-02-08 DIAGNOSIS — Z0000 Encounter for general adult medical examination without abnormal findings: Secondary | ICD-10-CM | POA: Diagnosis not present

## 2021-02-08 NOTE — Assessment & Plan Note (Signed)
Annual physical as above, checking routine labs. 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

## 2021-02-08 NOTE — Assessment & Plan Note (Signed)
Mild conjunctival pallor on exam, rechecking iron indices, has been off of iron supplementation.

## 2021-02-08 NOTE — Progress Notes (Signed)
Subjective:    CC: Annual Physical Exam  HPI:  This patient is here for their annual physical  I reviewed the past medical history, family history, social history, surgical history, and allergies today and no changes were needed.  Please see the problem list section below in epic for further details.  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[place])    Platelet dysfunction (HCC)    Past Surgical History: Past Surgical History:  Procedure Laterality Date   BASAL CELL CARCINOMA EXCISION  5/16   INGUINAL HERNIA REPAIR     TYMPANOSTOMY TUBE PLACEMENT     WISDOM TOOTH EXTRACTION  2014   Social History: Social History   Socioeconomic History   Marital status: Single    Spouse name: Not on file   Number of children: Not on file   Years of education: Not on file   Highest education level: Not on file  Occupational History   Not on file  Tobacco Use   Smoking status: Never   Smokeless tobacco: Never  Vaping Use   Vaping Use: Never used  Substance and Sexual Activity   Alcohol use: Yes    Comment: 1-4 a week   Drug use: No   Sexual activity: Not Currently    Partners: Male    Birth control/protection: OCP  Other Topics Concern   Not on file  Social History Narrative   Not on file   Social Determinants of Health   Financial Resource Strain: Not on file  Food Insecurity: Not on file  Transportation Needs: Not on file  Physical Activity: Not on file  Stress: Not on file  Social Connections: Not on file   Family History: Family History  Problem Relation Age of Onset   Cancer Mother 37   Breast cancer Mother    Other Mother        h/o adenomyosis   Varicose Veins Mother    Clotting disorder Brother        Factor XI   Diabetes Maternal Grandmother    Hypertension Maternal Grandmother    Other Maternal Grandmother        pancreatic stone    Hypertension Maternal Grandfather    Depression Maternal Grandfather    Diabetes Paternal Grandfather        and on dialysis   Heart Problems Paternal Grandfather        heart transplant   Heart disease Paternal Grandfather        heart transplant   Cancer Other        maternal great grandmother-Stage III, adenocarcenoma on broad ligament of fallopian tube   Uterine cancer Other        maternal great aunt   Cancer Other        maternal cousin-cervical or ovarian   Stroke Father    Allergies: No Known Allergies Medications: See med rec.  Review of Systems: No headache, visual changes, nausea, vomiting, diarrhea, constipation, dizziness, abdominal pain, skin rash, fevers, chills, night sweats, swollen lymph nodes, weight loss, chest pain, body aches, joint swelling, muscle aches, shortness of breath, mood changes, visual or auditory hallucinations.  Objective:    General: Well Developed, well nourished, and in no acute distress.  Neuro: Alert and oriented x3, extra-ocular muscles intact, sensation grossly intact. Cranial nerves II through XII are intact, motor,  sensory, and coordinative functions are all intact. HEENT: Normocephalic, atraumatic, pupils equal round reactive to light, neck supple, no masses, no lymphadenopathy, thyroid nonpalpable. Oropharynx, nasopharynx, external ear canals are unremarkable.  Mild conjunctival pallor. Skin: Warm and dry, no rashes noted.  Cardiac: Regular rate and rhythm, no murmurs rubs or gallops.  Respiratory: Clear to auscultation bilaterally. Not using accessory muscles, speaking in full sentences.  Abdominal: Soft, nontender, nondistended, positive bowel sounds, no masses, no organomegaly.  Musculoskeletal: Shoulder, elbow, wrist, hip, knee, ankle stable, and with full range of motion.  Impression and Recommendations:    The patient was counselled, risk factors were discussed, anticipatory guidance given.  Annual physical exam Annual  physical as above, checking routine labs. 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  Normocytic anemia Mild conjunctival pallor on exam, rechecking iron indices, has been off of iron supplementation.   ___________________________________________ Gwen Her. Dianah Field, M.D., ABFM., CAQSM. Primary Care and Sports Medicine [facility]  Adjunct Professor of [place] [employer] [facility] [employer]

## 2021-02-16 LAB — COMPREHENSIVE METABOLIC PANEL
AG Ratio: 1.2 (calc) (ref 1.0–2.5)
ALT: 10 U/L (ref 6–29)
AST: 14 U/L (ref 10–30)
Albumin: 4.1 g/dL (ref 3.6–5.1)
Alkaline phosphatase (APISO): 48 U/L (ref 31–125)
BUN: 15 mg/dL (ref 7–25)
CO2: 25 mmol/L (ref 20–32)
Calcium: 9.3 mg/dL (ref 8.6–10.2)
Chloride: 101 mmol/L (ref 98–110)
Creat: 0.88 mg/dL (ref 0.50–1.10)
Globulin: 3.4 g/dL (calc) (ref 1.9–3.7)
Glucose, Bld: 84 mg/dL (ref 65–99)
Potassium: 4.5 mmol/L (ref 3.5–5.3)
Sodium: 137 mmol/L (ref 135–146)
Total Bilirubin: 0.3 mg/dL (ref 0.2–1.2)
Total Protein: 7.5 g/dL (ref 6.1–8.1)

## 2021-02-16 LAB — LIPID PANEL
Cholesterol: 221 mg/dL — ABNORMAL HIGH (ref <200)
HDL: 83 mg/dL (ref >=50)
LDL Cholesterol (Calc): 109 mg/dL (calc) — ABNORMAL HIGH
Non-HDL Cholesterol (Calc): 138 mg/dL (calc) — ABNORMAL HIGH (ref <130)
Total CHOL/HDL Ratio: 2.7 (calc) (ref <5.0)
Triglycerides: 170 mg/dL — ABNORMAL HIGH (ref <150)

## 2021-02-16 LAB — CBC
HCT: 38.8 % (ref 35.0–45.0)
Hemoglobin: 13.1 g/dL (ref 11.7–15.5)
MCH: 29.2 pg (ref 27.0–33.0)
MCHC: 33.8 g/dL (ref 32.0–36.0)
MCV: 86.4 fL (ref 80.0–100.0)
MPV: 8.8 fL (ref 7.5–12.5)
Platelets: 306 10*3/uL (ref 140–400)
RBC: 4.49 10*6/uL (ref 3.80–5.10)
RDW: 12.6 % (ref 11.0–15.0)
WBC: 5.7 10*3/uL (ref 3.8–10.8)

## 2021-02-16 LAB — RETICULOCYTES
ABS Retic: 49390 cells/uL (ref 20000–80000)
Retic Ct Pct: 1.1 %

## 2021-02-16 LAB — IRON,TIBC AND FERRITIN PANEL
%SAT: 23 % (calc) (ref 16–45)
Ferritin: 20 ng/mL (ref 16–154)
Iron: 100 ug/dL (ref 40–190)
TIBC: 437 mcg/dL (calc) (ref 250–450)

## 2021-02-16 LAB — QUANTIFERON-TB GOLD PLUS
Mitogen-NIL: 6.31 IU/mL
NIL: 0.03 IU/mL
QuantiFERON-TB Gold Plus: NEGATIVE
TB1-NIL: 0.01 IU/mL
TB2-NIL: 0 IU/mL

## 2021-02-16 LAB — TSH: TSH: 2.35 mIU/L

## 2021-02-16 LAB — B12 AND FOLATE PANEL
Folate: 17.1 ng/mL
Vitamin B-12: 411 pg/mL (ref 200–1100)

## 2021-03-23 ENCOUNTER — Ambulatory Visit (HOSPITAL_BASED_OUTPATIENT_CLINIC_OR_DEPARTMENT_OTHER): Payer: Federal, State, Local not specified - PPO | Admitting: Obstetrics & Gynecology

## 2021-05-13 ENCOUNTER — Ambulatory Visit (HOSPITAL_BASED_OUTPATIENT_CLINIC_OR_DEPARTMENT_OTHER): Payer: Federal, State, Local not specified - PPO | Admitting: Obstetrics & Gynecology

## 2021-06-28 ENCOUNTER — Ambulatory Visit (INDEPENDENT_AMBULATORY_CARE_PROVIDER_SITE_OTHER): Payer: Federal, State, Local not specified - PPO | Admitting: Obstetrics & Gynecology

## 2021-06-28 ENCOUNTER — Other Ambulatory Visit: Payer: Self-pay

## 2021-06-28 ENCOUNTER — Other Ambulatory Visit (HOSPITAL_COMMUNITY)
Admission: RE | Admit: 2021-06-28 | Discharge: 2021-06-28 | Disposition: A | Discharge status: Home / Self Care | Payer: Federal, State, Local not specified - PPO | Source: Ambulatory Visit | Attending: Obstetrics & Gynecology | Admitting: Obstetrics & Gynecology

## 2021-06-28 ENCOUNTER — Encounter (HOSPITAL_BASED_OUTPATIENT_CLINIC_OR_DEPARTMENT_OTHER): Payer: Self-pay | Admitting: Obstetrics & Gynecology

## 2021-06-28 VITALS — BP 117/82 | HR 83 | Ht 67.0 in | Wt 146.6 lb

## 2021-06-28 DIAGNOSIS — Z8742 Personal history of other diseases of the female genital tract: Secondary | ICD-10-CM | POA: Diagnosis not present

## 2021-06-28 DIAGNOSIS — D682 Hereditary deficiency of other clotting factors: Secondary | ICD-10-CM

## 2021-06-28 DIAGNOSIS — E781 Pure hyperglyceridemia: Secondary | ICD-10-CM | POA: Diagnosis not present

## 2021-06-28 DIAGNOSIS — Z01419 Encounter for gynecological examination (general) (routine) without abnormal findings: Secondary | ICD-10-CM

## 2021-06-28 DIAGNOSIS — Z113 Encounter for screening for infections with a predominantly sexual mode of transmission: Secondary | ICD-10-CM | POA: Diagnosis present

## 2021-06-28 MED ORDER — NORGESTIM-ETH ESTRAD TRIPHASIC 0.18/0.215/0.25 MG-35 MCG PO TABS: 1.0000 | ORAL_TABLET | Freq: Every day | ORAL | 3 refills | Status: DC

## 2021-06-28 NOTE — Progress Notes (Signed)
25 y.o. G0P0 Single White or Caucasian female here for annual exam.  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  She is still working in the [employer].  Is SA.  Would like STD testing today but no blood work.  Cycles are regular.  On OCPs.  Patient's last menstrual period was 06/16/2021 (exact date).          Sexually active: Yes.    The current method of family planning is OCP (estrogen/progesterone).    Exercising: Yes.     Gym and walking Smoker:  no  Health Maintenance: Pap:  12/02/19 neg History of abnormal Pap:  h/o lGSIL pap 2018 (prior to age 68) and two follow up pap smears have been normal. MMG:  guidelines reviewed Colonoscopy:  guidelines reviewed Screening Labs: done    reports that she has never smoked. She has never used smokeless tobacco. She reports current alcohol use. She reports that she does not use drugs.  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     Norgestimate-Ethinyl Estradiol Triphasic (TRI-ESTARYLLA) 0.18/0.215/0.25 MG-35 MCG tablet Take 1 tablet by mouth daily. 84 tablet 1   omega-3 acid ethyl esters (LOVAZA) 1 g capsule Take 2 capsules (2 g total) by mouth 2 (two) times daily. 360 capsule 3   No current facility-administered medications for this visit.    Family History  Problem Relation Age of Onset   Cancer Mother 61   Breast cancer Mother    Other Mother        h/o adenomyosis   Varicose Veins Mother    Clotting disorder Brother        Factor XI    Diabetes Maternal Grandmother    Hypertension Maternal Grandmother    Other Maternal Grandmother        pancreatic stone   Hypertension Maternal Grandfather    Depression Maternal Grandfather    Diabetes Paternal Grandfather        and on dialysis   Heart Problems Paternal Grandfather        heart transplant   Heart disease Paternal Grandfather        heart transplant   Cancer Other        maternal great grandmother-Stage III, adenocarcenoma on broad ligament of fallopian tube   Uterine cancer Other        maternal great aunt   Cancer Other        maternal cousin-cervical or ovarian   Stroke Father     Review of Systems  All other systems reviewed and are negative.  Exam:   BP 117/82   Pulse 83   Ht 5\' 7"  (1.702 m)   Wt 146 lb 9.6 oz (66.5 kg)   LMP 06/16/2021 (Exact Date)   BMI 22.96 kg/m   Height: 5\' 7"  (170.2 cm)  General appearance: alert, cooperative and appears stated age Head: Normocephalic, without  obvious abnormality, atraumatic Neck: no adenopathy, supple, symmetrical, trachea midline and thyroid normal to inspection and palpation Lungs: clear to auscultation bilaterally Breasts: normal appearance, no masses or tenderness Heart: regular rate and rhythm Abdomen: soft, non-tender; bowel sounds normal; no masses,  no organomegaly Extremities: extremities normal, atraumatic, no cyanosis or edema Skin: Skin color, texture, turgor normal. No rashes or lesions Lymph nodes: Cervical, supraclavicular, and axillary nodes normal. No abnormal inguinal nodes palpated Neurologic: Grossly normal  Pelvic: External genitalia:  no lesions              Urethra:  normal appearing urethra with no masses, tenderness or lesions              Bartholins and Skenes: normal                 Vagina: normal appearing vagina with normal color and no discharge, no lesions              Cervix: no lesions              Pap taken: No. Bimanual Exam:  Uterus:  normal size, contour,  position, consistency, mobility, non-tender              Adnexa: normal adnexa and no mass, fullness, tenderness               Rectovaginal: Confirms               Anus:  normal sphincter tone, no lesions  Chaperone, Octaviano Batty, CMA, was present for exam.  Assessment/Plan: 1. Well woman exam with routine gynecological exam - pap not indicated this year.  H/O LGSIL pap smear with normal follow up 11/19 and 11/2019.   2. Hypertriglyceridemia - being followed by PCP  3. History of abnormal cervical Pap smear  4. 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  Needs treatment prior to surgery.  5. Screening examination for STD (sexually transmitted disease) - Cervicovaginal ancillary only( [place])

## 2021-06-29 LAB — CERVICOVAGINAL ANCILLARY ONLY
Chlamydia: NEGATIVE
Comment: NEGATIVE
Comment: NEGATIVE
Comment: NORMAL
Neisseria Gonorrhea: NEGATIVE
Trichomonas: NEGATIVE

## 2021-09-07 ENCOUNTER — Encounter (HOSPITAL_BASED_OUTPATIENT_CLINIC_OR_DEPARTMENT_OTHER): Payer: Self-pay | Admitting: Obstetrics & Gynecology

## 2021-10-16 IMAGING — DX DG CERVICAL SPINE COMPLETE 4+V
6 series · 6 of 6 positions shown · non-contrast
Comparison: None.

CLINICAL DATA: Neck pain

EXAM:
CERVICAL SPINE - COMPLETE 4+ VIEW

[c-spine lat]
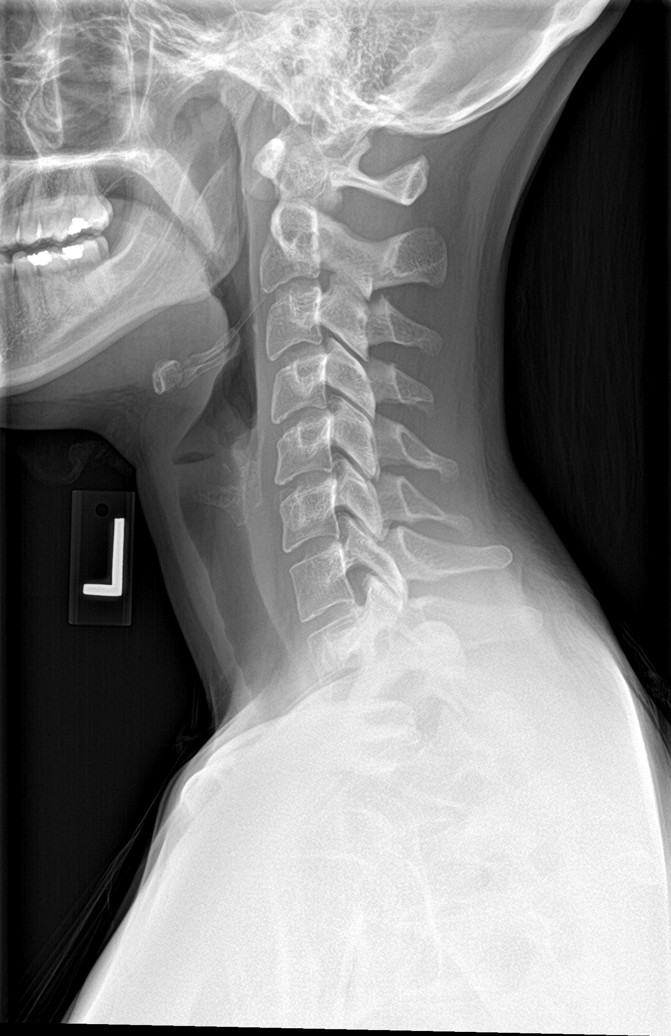

[c-spine obl (1 of 2)]
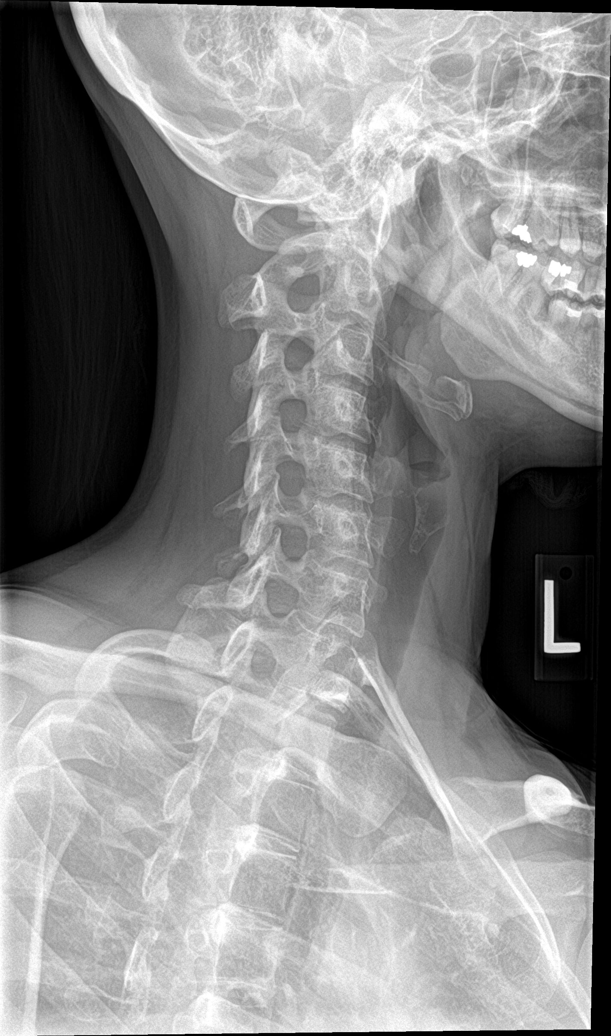

[c-spine obl (2 of 2)]
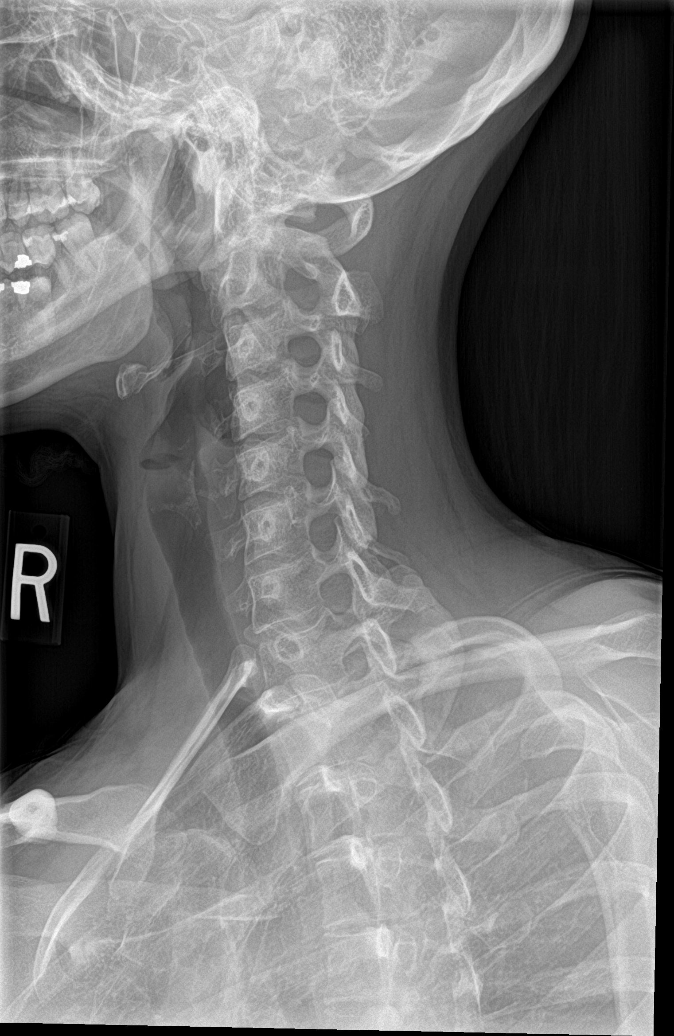

[c-spine ap]
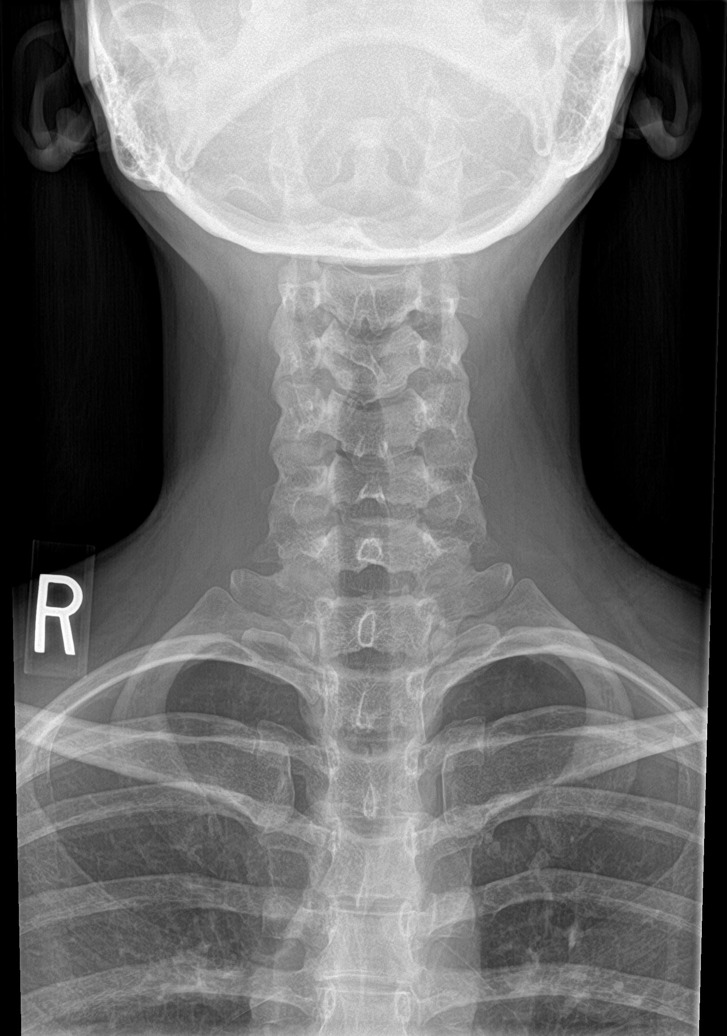

[c-spine open mouth]
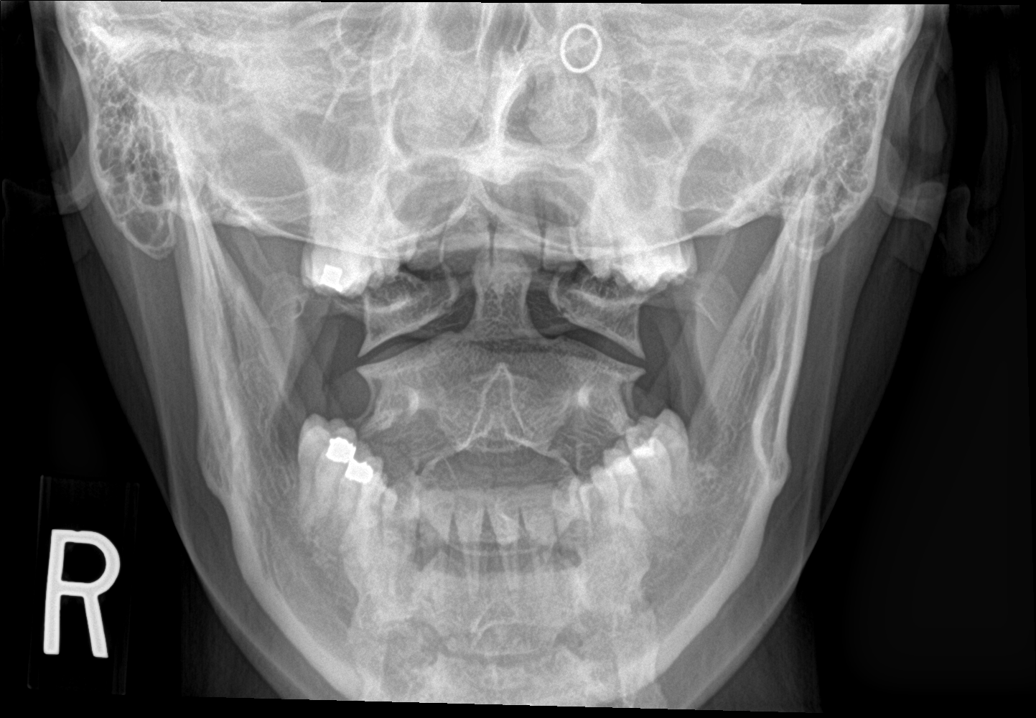

[c-spine swimmers]
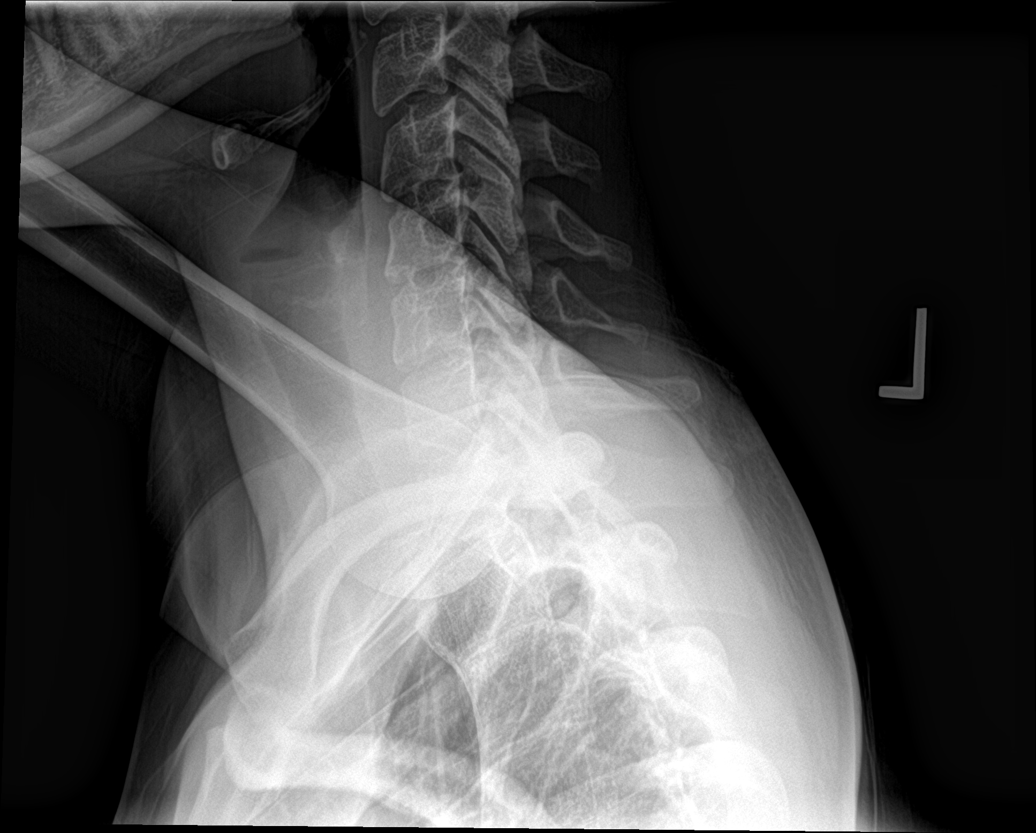

[6 of 6 positions shown; findings below may reference images not displayed]

FINDINGS: There is no evidence of cervical spine fracture or prevertebral soft
tissue swelling. Alignment is normal. No other significant bone
abnormalities are identified.
IMPRESSION: Negative cervical spine radiographs.

## 2022-06-30 ENCOUNTER — Ambulatory Visit (INDEPENDENT_AMBULATORY_CARE_PROVIDER_SITE_OTHER): Payer: 59 | Admitting: Obstetrics & Gynecology

## 2022-06-30 ENCOUNTER — Encounter (HOSPITAL_BASED_OUTPATIENT_CLINIC_OR_DEPARTMENT_OTHER): Payer: Self-pay | Admitting: Obstetrics & Gynecology

## 2022-06-30 VITALS — BP 116/75 | HR 73 | Ht 67.0 in | Wt 149.6 lb

## 2022-06-30 DIAGNOSIS — D682 Hereditary deficiency of other clotting factors: Secondary | ICD-10-CM | POA: Diagnosis not present

## 2022-06-30 DIAGNOSIS — Z803 Family history of malignant neoplasm of breast: Secondary | ICD-10-CM | POA: Diagnosis not present

## 2022-06-30 DIAGNOSIS — Z01419 Encounter for gynecological examination (general) (routine) without abnormal findings: Secondary | ICD-10-CM

## 2022-06-30 DIAGNOSIS — Z3041 Encounter for surveillance of contraceptive pills: Secondary | ICD-10-CM | POA: Diagnosis not present

## 2022-06-30 MED ORDER — NORGESTIM-ETH ESTRAD TRIPHASIC 0.18/0.215/0.25 MG-35 MCG PO TABS: 1.0000 | ORAL_TABLET | Freq: Every day | ORAL | 3 refills | Status: DC

## 2022-06-30 NOTE — Progress Notes (Signed)
26 y.o. G0P0 Single White or Caucasian female here for annual exam.  Doing well.  Continues to work in the [employer] [employer].  Has love/hate relationship with work.  Together with same person.  Does not desire STD testing.    Cycles are regular.  On OCPs.    Patient's last menstrual period was 05/19/2022 (approximate).          Sexually active: Yes.    The current method of family planning is OCP (estrogen/progesterone).    Smoker:  no  Health Maintenance: Pap:  12/02/2019 Negative History of abnormal Pap:  no MMG:  guidelines reviewed Colonoscopy:  guidelines reviewed Screening Labs: lab work followed by PCP   reports that she has never smoked. She has never used smokeless tobacco. She reports current alcohol use. She reports that she does not use drugs.  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     Norgestimate-Ethinyl Estradiol Triphasic (TRI-ESTARYLLA) 0.18/0.215/0.25 MG-35 MCG tablet Take 1 tablet by mouth daily. 84 tablet 3   omega-3 acid ethyl esters (LOVAZA) 1 g capsule Take 2 capsules (2 g total) by mouth 2 (two) times daily. 360 capsule 3   No current facility-administered medications for this visit.    Family History  Problem Relation Age of Onset   Cancer Mother 27   Breast cancer Mother    Other Mother        h/o adenomyosis   Varicose Veins Mother    Clotting disorder Brother        Factor XI   Diabetes Maternal Grandmother    Hypertension Maternal Grandmother    Other Maternal Grandmother        pancreatic stone    Hypertension Maternal Grandfather    Depression Maternal Grandfather    Diabetes Paternal Grandfather        and on dialysis   Heart Problems Paternal Grandfather        heart transplant   Heart disease Paternal Grandfather        heart transplant   Cancer Other        maternal great grandmother-Stage III, adenocarcenoma on broad ligament of fallopian tube   Uterine cancer Other        maternal great aunt   Cancer Other        maternal cousin-cervical or ovarian   Stroke Father     ROS: Constitutional: negative Genitourinary:negative  Exam:   BP 116/75 (BP Location: Left Arm, Patient Position: Sitting, Cuff Size: Normal)   Pulse 73   Ht '5\' 7"'$  (1.702 m) Comment: Reported  Wt 149 lb 9.6 oz (67.9 kg)   LMP 05/19/2022 (Approximate)   BMI 23.43 kg/m   Height: '5\' 7"'$  (170.2 cm) (Reported)  General appearance: alert, cooperative and appears stated age Head: Normocephalic, without obvious abnormality, atraumatic Neck: no adenopathy, supple, symmetrical, trachea midline and thyroid normal to inspection and palpation Lungs: clear to auscultation bilaterally Breasts: normal appearance, no masses or tenderness  Heart: regular rate and rhythm Abdomen: soft, non-tender; bowel sounds normal; no masses,  no organomegaly Extremities: extremities normal, atraumatic, no cyanosis or edema Skin: Skin color, texture, turgor normal. No rashes or lesions Lymph nodes: Cervical, supraclavicular, and axillary nodes normal. No abnormal inguinal nodes palpated Neurologic: Grossly normal   Pelvic: External genitalia:  no lesions              Urethra:  normal appearing urethra with no masses, tenderness or lesions              Bartholins and Skenes: normal                 Vagina: normal appearing vagina with normal color and no discharge, no lesions              Cervix: no lesions              Pap taken: No. Bimanual Exam:  Uterus:  uterus absent              Adnexa: normal adnexa and no mass,  fullness, tenderness               Rectovaginal: Confirms               Anus:  normal sphincter tone, no lesions  Chaperone, Octaviano Batty, CMA, was present for exam.  Assessment/Plan: 1. Well woman exam with routine gynecological exam - Pap smear 2021.  Will plan to repeat next year. - Mammogram guidelines reviewed.  Will plan to start about age 33 due to mothr's hx. - Colonoscopy guidelines reviewed - lab work is done with PCP - vaccines reviewed/updated  2. Family history of breast cancer in mother  25. 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  Needs treatment prior to surgery.   4. Encounter for surveillance of contraceptive pills - RF for OCPs to mail order pharmacy.

## 2022-12-22 ENCOUNTER — Other Ambulatory Visit (HOSPITAL_BASED_OUTPATIENT_CLINIC_OR_DEPARTMENT_OTHER): Payer: Self-pay | Admitting: Obstetrics & Gynecology

## 2022-12-22 ENCOUNTER — Other Ambulatory Visit (HOSPITAL_BASED_OUTPATIENT_CLINIC_OR_DEPARTMENT_OTHER): Payer: Self-pay | Admitting: *Deleted

## 2022-12-22 MED ORDER — NORGESTIM-ETH ESTRAD TRIPHASIC 0.18/0.215/0.25 MG-35 MCG PO TABS: 1.0000 | ORAL_TABLET | Freq: Every day | ORAL | 3 refills | Status: DC

## 2023-01-05 ENCOUNTER — Other Ambulatory Visit (HOSPITAL_BASED_OUTPATIENT_CLINIC_OR_DEPARTMENT_OTHER): Payer: Self-pay | Admitting: *Deleted

## 2023-01-05 ENCOUNTER — Encounter (HOSPITAL_BASED_OUTPATIENT_CLINIC_OR_DEPARTMENT_OTHER): Payer: Self-pay | Admitting: Obstetrics & Gynecology

## 2023-01-05 MED ORDER — NORGESTIM-ETH ESTRAD TRIPHASIC 0.18/0.215/0.25 MG-35 MCG PO TABS: 1.0000 | ORAL_TABLET | Freq: Every day | ORAL | 3 refills | Status: DC

## 2023-04-19 ENCOUNTER — Other Ambulatory Visit (HOSPITAL_BASED_OUTPATIENT_CLINIC_OR_DEPARTMENT_OTHER): Payer: Self-pay | Admitting: Obstetrics & Gynecology

## 2023-07-02 ENCOUNTER — Other Ambulatory Visit (HOSPITAL_BASED_OUTPATIENT_CLINIC_OR_DEPARTMENT_OTHER): Payer: Self-pay | Admitting: Obstetrics & Gynecology

## 2023-07-05 NOTE — Telephone Encounter (Signed)
LMOVM for pt to call regarding refill (does she have enough to last until aex?)

## 2023-07-13 ENCOUNTER — Encounter (HOSPITAL_BASED_OUTPATIENT_CLINIC_OR_DEPARTMENT_OTHER): Payer: Self-pay | Admitting: Obstetrics & Gynecology

## 2023-07-13 ENCOUNTER — Other Ambulatory Visit (HOSPITAL_COMMUNITY)
Admission: RE | Admit: 2023-07-13 | Discharge: 2023-07-13 | Disposition: A | Discharge status: Home / Self Care | Payer: Managed Care, Other (non HMO) | Source: Ambulatory Visit | Attending: Obstetrics & Gynecology | Admitting: Obstetrics & Gynecology

## 2023-07-13 ENCOUNTER — Ambulatory Visit (HOSPITAL_BASED_OUTPATIENT_CLINIC_OR_DEPARTMENT_OTHER): Payer: Managed Care, Other (non HMO) | Admitting: Obstetrics & Gynecology

## 2023-07-13 VITALS — BP 127/54 | HR 79 | Ht 67.25 in | Wt 153.2 lb

## 2023-07-13 DIAGNOSIS — Z85828 Personal history of other malignant neoplasm of skin: Secondary | ICD-10-CM

## 2023-07-13 DIAGNOSIS — D682 Hereditary deficiency of other clotting factors: Secondary | ICD-10-CM | POA: Diagnosis not present

## 2023-07-13 DIAGNOSIS — Z124 Encounter for screening for malignant neoplasm of cervix: Secondary | ICD-10-CM

## 2023-07-13 DIAGNOSIS — Z3041 Encounter for surveillance of contraceptive pills: Secondary | ICD-10-CM | POA: Diagnosis not present

## 2023-07-13 DIAGNOSIS — Z01419 Encounter for gynecological examination (general) (routine) without abnormal findings: Secondary | ICD-10-CM

## 2023-07-13 MED ORDER — NORGESTIM-ETH ESTRAD TRIPHASIC 0.18/0.215/0.25 MG-35 MCG PO TABS: 1.0000 | ORAL_TABLET | Freq: Every day | ORAL | 3 refills | Status: DC

## 2023-07-13 NOTE — Progress Notes (Signed)
27 y.o. G0P0 Single White or Caucasian female here for annual exam.  Doing well.  Cycles are regular except for the last one where she has a little dark brown bleeding before her cycle started.    Patient's last menstrual period was 07/13/2023.          Sexually active: Yes.     Upstream - 07/13/23 1427       Pregnancy Intention Screening   Does the patient want to become pregnant in the next year? No    Does the patient's partner want to become pregnant in the next year? No    Would the patient like to discuss contraceptive options today? No      Contraception Wrap Up   Current Method Oral Contraceptive             Health Maintenance: Pap:  12/02/2019 Negative History of abnormal Pap:  first pap was abnormal and normal since MMG:  guidelines reviewed Colonoscopy:  guidelines reviewed Screening Labs: does with PCP, last done 2022   reports that she has never smoked. She has never used smokeless tobacco. She reports current alcohol use. She reports that she does not use drugs.  Past Medical History:  Diagnosis Date   ADHD    Basal cell carcinoma ~ 02/2015   Scalp / removed   Elevated lipids    Factor XI deficiency (HCC)    Platelet dysfunction (HCC)     Past Surgical History:  Procedure Laterality Date   BASAL CELL CARCINOMA EXCISION  5/16   INGUINAL HERNIA REPAIR     TYMPANOSTOMY TUBE PLACEMENT     WISDOM TOOTH EXTRACTION  2014    Current Outpatient Medications  Medication Sig Dispense Refill   Multiple Vitamin (MULTIVITAMIN) tablet Take 1 tablet by mouth daily.     Norgestimate-Ethinyl Estradiol Triphasic 0.18/0.215/0.25 MG-35 MCG tablet TAKE 1 TABLET BY MOUTH EVERY DAY 28 tablet 2   omega-3 acid ethyl esters (LOVAZA) 1 g capsule Take 2 capsules (2 g total) by mouth 2 (two) times daily. (Patient not taking: Reported on 07/13/2023) 360 capsule 3   No current facility-administered medications for this visit.    Family History  Problem Relation Age of Onset    Cancer Mother 53   Breast cancer Mother    Other Mother        h/o adenomyosis   Varicose Veins Mother    Clotting disorder Brother        Factor XI   Diabetes Maternal Grandmother    Hypertension Maternal Grandmother    Other Maternal Grandmother        pancreatic stone   Hypertension Maternal Grandfather    Depression Maternal Grandfather    Diabetes Paternal Grandfather        and on dialysis   Heart Problems Paternal Grandfather        heart transplant   Heart disease Paternal Grandfather        heart transplant   Cancer Other        maternal great grandmother-Stage III, adenocarcenoma on broad ligament of fallopian tube   Uterine cancer Other        maternal great aunt   Cancer Other        maternal cousin-cervical or ovarian   Stroke Father     ROS: Constitutional: negative Genitourinary:negative  Exam:   BP (!) 127/54 (BP Location: Right Arm, Patient Position: Sitting, Cuff Size: Normal)   Pulse 79   Ht 5' 7.25" (1.708 m)  Wt 153 lb 3.2 oz (69.5 kg)   LMP 07/13/2023   BMI 23.82 kg/m   Height: 5' 7.25" (170.8 cm)  General appearance: alert, cooperative and appears stated age Head: Normocephalic, without obvious abnormality, atraumatic Neck: no adenopathy, supple, symmetrical, trachea midline and thyroid normal to inspection and palpation Lungs: clear to auscultation bilaterally Breasts: normal appearance, no masses or tenderness Heart: regular rate and rhythm Abdomen: soft, non-tender; bowel sounds normal; no masses,  no organomegaly Extremities: extremities normal, atraumatic, no cyanosis or edema Skin: Skin color, texture, turgor normal. No rashes or lesions Lymph nodes: Cervical, supraclavicular, and axillary nodes normal. No abnormal inguinal nodes palpated Neurologic: Grossly normal   Pelvic: External genitalia:  no lesions              Urethra:  normal appearing urethra with no masses, tenderness or lesions              Bartholins and Skenes:  normal                 Vagina: normal appearing vagina with normal color and no discharge, no lesions              Cervix: no lesions              Pap taken: Yes.   Bimanual Exam:  Uterus:  normal size, contour, position, consistency, mobility, non-tender              Adnexa: no mass, fullness, tenderness               Rectovaginal: Confirms               Anus:  normal sphincter tone, no lesions  Chaperone, Ina Homes, CMA, was present for exam.  Assessment/Plan: 1. Well woman exam with routine gynecological exam - Pap smear obtained today - Mammogram guidelines reviewed - Colonoscopy guidelines reviewed  - lab work done with PCP in 2022 - vaccines reviewed/updated  2. Encounter for surveillance of contraceptive pills - Norgestimate-Ethinyl Estradiol Triphasic 0.18/0.215/0.25 MG-35 MCG tablet; Take 1 tablet by mouth daily.  Dispense: 84 tablet; Refill: 3  3. Cervical cancer screening - Cytology - PAP( [place])  4. Factor X deficiency (HCC)  5. History of basal cell carcinoma - followed by [facility]

## 2023-07-14 LAB — CYTOLOGY - PAP
Adequacy: ABSENT
Diagnosis: NEGATIVE

## 2024-06-15 ENCOUNTER — Other Ambulatory Visit (HOSPITAL_BASED_OUTPATIENT_CLINIC_OR_DEPARTMENT_OTHER): Payer: Self-pay | Admitting: Obstetrics & Gynecology

## 2024-06-15 DIAGNOSIS — Z3041 Encounter for surveillance of contraceptive pills: Secondary | ICD-10-CM

## 2024-07-15 ENCOUNTER — Ambulatory Visit (HOSPITAL_BASED_OUTPATIENT_CLINIC_OR_DEPARTMENT_OTHER): Payer: Managed Care, Other (non HMO) | Admitting: Obstetrics & Gynecology

## 2024-08-01 ENCOUNTER — Ambulatory Visit (INDEPENDENT_AMBULATORY_CARE_PROVIDER_SITE_OTHER): Payer: Self-pay | Admitting: Obstetrics & Gynecology

## 2024-08-01 ENCOUNTER — Encounter (HOSPITAL_BASED_OUTPATIENT_CLINIC_OR_DEPARTMENT_OTHER): Payer: Self-pay | Admitting: Obstetrics & Gynecology

## 2024-08-01 VITALS — BP 135/82 | HR 80 | Ht 67.0 in | Wt 155.3 lb

## 2024-08-01 DIAGNOSIS — Z01419 Encounter for gynecological examination (general) (routine) without abnormal findings: Secondary | ICD-10-CM | POA: Diagnosis not present

## 2024-08-01 DIAGNOSIS — Z1331 Encounter for screening for depression: Secondary | ICD-10-CM | POA: Diagnosis not present

## 2024-08-01 DIAGNOSIS — Z793 Long term (current) use of hormonal contraceptives: Secondary | ICD-10-CM | POA: Diagnosis not present

## 2024-08-01 DIAGNOSIS — D681 Hereditary factor XI deficiency: Secondary | ICD-10-CM

## 2024-08-01 DIAGNOSIS — Z3041 Encounter for surveillance of contraceptive pills: Secondary | ICD-10-CM

## 2024-08-01 DIAGNOSIS — E785 Hyperlipidemia, unspecified: Secondary | ICD-10-CM | POA: Diagnosis not present

## 2024-08-01 MED ORDER — NORGESTIM-ETH ESTRAD TRIPHASIC 0.18/0.215/0.25 MG-35 MCG PO TABS: 1.0000 | ORAL_TABLET | Freq: Every day | ORAL | 3 refills | Status: AC

## 2024-08-01 NOTE — Progress Notes (Signed)
 ANNUAL EXAM Patient name: Veronica [name] MRN [id number]  Date of birth: 1996-04-21 Chief Complaint:   Gynecologic Exam  History of Present Illness:   Veronica [name] is a 28 y.o. G0P0 Caucasian female being seen today for a routine annual exam.  Doing well.  Cycles are regular.  Has gotten engaged.  Doesn't have a date set yet.    Patient's last menstrual period was 07/09/2024 (exact date).   Last pap 07/13/2023. Results were: NILM w/ HRHPV not done. H/O abnormal pap: yes Last mammogram: Family h/o breast cancer: yes mother Last colonoscopy: Family h/o colorectal cancer: no     08/01/2024    2:30 PM 07/13/2023    2:27 PM 06/30/2022    2:43 PM 06/28/2021    3:04 PM 08/20/2020   12:06 PM  Depression screen PHQ 2/9  Decreased Interest 0 0 0 0 0  Down, Depressed, Hopeless 1 0 0 0 0  PHQ - 2 Score 1 0 0 0 0        08/01/2024    2:30 PM 04/25/2019   11:13 AM  GAD 7 : Generalized Anxiety Score  Nervous, Anxious, on Edge 0 1  Control/stop worrying 0 1  Worry too much - different things 1 2  Trouble relaxing 0 1  Restless 0 1  Easily annoyed or irritable 1 1  Afraid - awful might happen 0 0  Total GAD 7 Score 2 7  Anxiety Difficulty Somewhat difficult Somewhat difficult     Review of Systems:   Pertinent items are noted in HPI Denies abnormal bleeding, bladder or bowel changes.  Denies pelvic pain.   Pertinent History Reviewed:  Reviewed past medical,surgical, social and family history.  Reviewed problem list, medications and allergies. Physical Assessment:   Vitals:   08/01/24 1425 08/01/24 1431  BP: (!) 130/90 135/82  Pulse: 86 80  SpO2: 100% 100%  Weight: 155 lb 4.8 oz (70.4 kg)   Height: 5' 7 (1.702 m)   Body mass index is 24.32 kg/m.        Physical Examination:   General appearance - well appearing, and in no distress  Mental status - alert, oriented to person, place, and time  Psych:  She has a normal mood and affect  Skin - warm and dry,  normal color, no suspicious lesions noted  Chest - effort normal, all lung fields clear to auscultation bilaterally  Heart - normal rate and regular rhythm  Neck:  midline trachea, no thyromegaly or nodules  Breasts - breasts appear normal, no suspicious masses, no skin or nipple changes or  axillary nodes  Abdomen - soft, nontender, nondistended, no masses or organomegaly  Pelvic - VULVA: normal appearing vulva with no masses, tenderness or lesions   VAGINA: normal appearing vagina with normal color and discharge, no lesions   CERVIX: normal appearing cervix without discharge or lesions, no CMT  Thin prep pap is not indicated today  UTERUS: uterus is felt to be normal size, shape, consistency and nontender   ADNEXA: No adnexal masses or tenderness noted.  Rectal - normal rectal, good sphincter tone, no masses felt.   Extremities:  No swelling or varicosities noted  Chaperone present for exam  No results found for this or any previous visit (from the past 24 hours).  Assessment & Plan:  1. Well woman exam with routine gynecological exam ([place]) - Pap smear neg 2024  Not indicated today. - Mammogram guidelines reviewed - Colonoscopy guidelines reviewed - lab  work ordered for future and she will return for fasting labs including lipid panel - vaccines reviewed/updated   2. Encounter for surveillance of contraceptive pills - Norgestimate-Ethinyl Estradiol Triphasic 0.18/0.215/0.25 MG-35 MCG tablet; Take 1 tablet by mouth daily.  Dispense: 84 tablet; Refill: 3  3. Factor XI deficiency (HCC) - would consider repeat consult prior to pregnancy.  She will let me know if/when she desires referral.  4. Elevated lipids - Comprehensive metabolic panel with GFR; Future - Hemoglobin A1c; Future - Lipid panel; Future - Lipid panel - Hemoglobin A1c - Comprehensive metabolic panel with GFR   No orders of the defined types were placed in this encounter.   Meds: No orders of the defined  types were placed in this encounter.   Follow-up: No follow-ups on file.  Sprague, Kaitlyn E, RN 08/01/2024 2:32 PM

## 2024-08-05 LAB — HEMOGLOBIN A1C
Est. average glucose Bld gHb Est-mCnc: 100 mg/dL
Hgb A1c MFr Bld: 5.1 % (ref 4.8–5.6)

## 2024-08-05 LAB — COMPREHENSIVE METABOLIC PANEL WITH GFR
ALT: 6 IU/L (ref 0–32)
AST: 15 IU/L (ref 0–40)
Albumin: 4.2 g/dL (ref 4.0–5.0)
Alkaline Phosphatase: 42 IU/L (ref 41–116)
BUN/Creatinine Ratio: 14 (ref 9–23)
BUN: 10 mg/dL (ref 6–20)
Bilirubin Total: 0.2 mg/dL (ref 0.0–1.2)
CO2: 21 mmol/L (ref 20–29)
Calcium: 9.1 mg/dL (ref 8.7–10.2)
Chloride: 104 mmol/L (ref 96–106)
Creatinine, Ser: 0.7 mg/dL (ref 0.57–1.00)
Globulin, Total: 2.5 g/dL (ref 1.5–4.5)
Glucose: 88 mg/dL (ref 70–99)
Potassium: 4.2 mmol/L (ref 3.5–5.2)
Sodium: 138 mmol/L (ref 134–144)
Total Protein: 6.7 g/dL (ref 6.0–8.5)
eGFR: 120 mL/min/1.73 (ref >59)

## 2024-08-06 ENCOUNTER — Ambulatory Visit (HOSPITAL_BASED_OUTPATIENT_CLINIC_OR_DEPARTMENT_OTHER): Payer: Self-pay | Admitting: Obstetrics & Gynecology

## 2024-08-12 LAB — LIPID PANEL
Chol/HDL Ratio: 2.7 ratio (ref 0.0–4.4)
Cholesterol, Total: 225 mg/dL — ABNORMAL HIGH (ref 100–199)
HDL: 83 mg/dL (ref >39)
LDL Chol Calc (NIH): 125 mg/dL — ABNORMAL HIGH (ref 0–99)
Triglycerides: 97 mg/dL (ref 0–149)
VLDL Cholesterol Cal: 17 mg/dL (ref 5–40)
